# Patient Record
Sex: Female | Born: 1935
Health system: Southern US, Community
[De-identification: ages and names within clinical notes are randomized; demographics above are authoritative.]

## PROBLEM LIST (undated history)

## (undated) DIAGNOSIS — I350 Nonrheumatic aortic (valve) stenosis: Secondary | ICD-10-CM

## (undated) DIAGNOSIS — I071 Rheumatic tricuspid insufficiency: Secondary | ICD-10-CM

## (undated) DIAGNOSIS — M8580 Other specified disorders of bone density and structure, unspecified site: Secondary | ICD-10-CM

## (undated) DIAGNOSIS — E785 Hyperlipidemia, unspecified: Secondary | ICD-10-CM

## (undated) DIAGNOSIS — I10 Essential (primary) hypertension: Secondary | ICD-10-CM

## (undated) DIAGNOSIS — D649 Anemia, unspecified: Secondary | ICD-10-CM

## (undated) HISTORY — DX: Anemia, unspecified: D64.9

## (undated) HISTORY — DX: Hyperlipidemia, unspecified: E78.5

## (undated) HISTORY — PX: HEMORRHOID SURGERY: SHX153

## (undated) HISTORY — DX: Nonrheumatic aortic (valve) stenosis: I35.0

## (undated) HISTORY — DX: Rheumatic tricuspid insufficiency: I07.1

## (undated) HISTORY — PX: OTHER SURGICAL HISTORY: SHX169

## (undated) HISTORY — DX: Essential (primary) hypertension: I10

## (undated) HISTORY — DX: Other specified disorders of bone density and structure, unspecified site: M85.80

---

## 1999-12-11 ENCOUNTER — Encounter: Payer: Self-pay | Admitting: Internal Medicine

## 1999-12-11 ENCOUNTER — Encounter: Admission: RE | Admit: 1999-12-11 | Discharge: 1999-12-11 | Payer: Self-pay | Admitting: Internal Medicine

## 2000-02-12 ENCOUNTER — Other Ambulatory Visit: Admission: RE | Admit: 2000-02-12 | Discharge: 2000-02-12 | Payer: Self-pay | Admitting: Internal Medicine

## 2001-02-24 ENCOUNTER — Encounter: Admission: RE | Admit: 2001-02-24 | Discharge: 2001-03-23 | Payer: Self-pay | Admitting: Internal Medicine

## 2002-12-14 ENCOUNTER — Encounter: Payer: Self-pay | Admitting: Internal Medicine

## 2002-12-14 ENCOUNTER — Encounter: Admission: RE | Admit: 2002-12-14 | Discharge: 2002-12-14 | Payer: Self-pay | Admitting: Internal Medicine

## 2004-08-16 ENCOUNTER — Ambulatory Visit: Payer: Self-pay | Admitting: Family Medicine

## 2004-10-15 ENCOUNTER — Ambulatory Visit: Payer: Self-pay | Admitting: Internal Medicine

## 2004-10-24 ENCOUNTER — Ambulatory Visit: Payer: Self-pay | Admitting: Internal Medicine

## 2005-03-05 ENCOUNTER — Ambulatory Visit (HOSPITAL_COMMUNITY): Admission: RE | Admit: 2005-03-05 | Discharge: 2005-03-05 | Payer: Self-pay | Admitting: Obstetrics

## 2005-04-21 ENCOUNTER — Ambulatory Visit: Payer: Self-pay | Admitting: Internal Medicine

## 2005-04-22 ENCOUNTER — Ambulatory Visit: Payer: Self-pay | Admitting: Internal Medicine

## 2005-05-09 ENCOUNTER — Ambulatory Visit: Payer: Self-pay | Admitting: Internal Medicine

## 2005-07-28 HISTORY — PX: OTHER SURGICAL HISTORY: SHX169

## 2005-08-20 ENCOUNTER — Ambulatory Visit: Payer: Self-pay | Admitting: Internal Medicine

## 2005-10-20 ENCOUNTER — Ambulatory Visit: Payer: Self-pay | Admitting: Internal Medicine

## 2005-11-21 ENCOUNTER — Ambulatory Visit: Payer: Self-pay | Admitting: Internal Medicine

## 2006-03-23 ENCOUNTER — Ambulatory Visit (HOSPITAL_COMMUNITY): Admission: RE | Admit: 2006-03-23 | Discharge: 2006-03-23 | Payer: Self-pay | Admitting: Internal Medicine

## 2006-05-14 ENCOUNTER — Ambulatory Visit: Payer: Self-pay | Admitting: Internal Medicine

## 2006-07-09 ENCOUNTER — Ambulatory Visit: Payer: Self-pay | Admitting: Internal Medicine

## 2006-10-12 ENCOUNTER — Ambulatory Visit: Payer: Self-pay | Admitting: Internal Medicine

## 2006-10-21 ENCOUNTER — Ambulatory Visit: Payer: Self-pay | Admitting: Internal Medicine

## 2006-10-21 LAB — CONVERTED CEMR LAB
AST: 23 units/L (ref 0–37)
Cholesterol: 167 mg/dL (ref 0–200)
Glucose, Bld: 100 mg/dL — ABNORMAL HIGH (ref 70–99)
Potassium: 3.8 meq/L (ref 3.5–5.1)
Total CHOL/HDL Ratio: 3.1
Triglycerides: 123 mg/dL (ref 0–149)

## 2006-11-02 ENCOUNTER — Ambulatory Visit: Payer: Self-pay | Admitting: Internal Medicine

## 2006-11-04 ENCOUNTER — Encounter: Payer: Self-pay | Admitting: Internal Medicine

## 2006-11-05 ENCOUNTER — Ambulatory Visit: Payer: Self-pay | Admitting: Internal Medicine

## 2006-11-09 ENCOUNTER — Other Ambulatory Visit: Admission: RE | Admit: 2006-11-09 | Discharge: 2006-11-09 | Payer: Self-pay | Admitting: Obstetrics and Gynecology

## 2007-04-26 ENCOUNTER — Ambulatory Visit (HOSPITAL_COMMUNITY): Admission: RE | Admit: 2007-04-26 | Discharge: 2007-04-26 | Payer: Self-pay | Admitting: Internal Medicine

## 2007-05-11 ENCOUNTER — Ambulatory Visit: Payer: Self-pay | Admitting: Internal Medicine

## 2007-05-17 ENCOUNTER — Encounter (INDEPENDENT_AMBULATORY_CARE_PROVIDER_SITE_OTHER): Payer: Self-pay | Admitting: *Deleted

## 2007-08-18 ENCOUNTER — Ambulatory Visit: Payer: Self-pay | Admitting: Internal Medicine

## 2007-08-18 DIAGNOSIS — M8580 Other specified disorders of bone density and structure, unspecified site: Secondary | ICD-10-CM

## 2007-10-18 ENCOUNTER — Telehealth (INDEPENDENT_AMBULATORY_CARE_PROVIDER_SITE_OTHER): Payer: Self-pay | Admitting: *Deleted

## 2007-10-25 ENCOUNTER — Ambulatory Visit: Payer: Self-pay | Admitting: Internal Medicine

## 2007-10-25 DIAGNOSIS — R7989 Other specified abnormal findings of blood chemistry: Secondary | ICD-10-CM | POA: Insufficient documentation

## 2007-10-25 DIAGNOSIS — E559 Vitamin D deficiency, unspecified: Secondary | ICD-10-CM | POA: Insufficient documentation

## 2007-10-25 DIAGNOSIS — E785 Hyperlipidemia, unspecified: Secondary | ICD-10-CM

## 2007-10-25 DIAGNOSIS — I10 Essential (primary) hypertension: Secondary | ICD-10-CM | POA: Insufficient documentation

## 2007-11-01 ENCOUNTER — Ambulatory Visit: Payer: Self-pay | Admitting: Internal Medicine

## 2007-11-01 ENCOUNTER — Encounter (INDEPENDENT_AMBULATORY_CARE_PROVIDER_SITE_OTHER): Payer: Self-pay | Admitting: *Deleted

## 2007-11-01 LAB — CONVERTED CEMR LAB: OCCULT 3: NEGATIVE

## 2008-04-19 ENCOUNTER — Ambulatory Visit: Payer: Self-pay | Admitting: Internal Medicine

## 2008-04-27 ENCOUNTER — Ambulatory Visit (HOSPITAL_COMMUNITY): Admission: RE | Admit: 2008-04-27 | Discharge: 2008-04-27 | Payer: Self-pay | Admitting: Internal Medicine

## 2008-10-16 ENCOUNTER — Ambulatory Visit: Payer: Self-pay | Admitting: Internal Medicine

## 2008-10-24 ENCOUNTER — Ambulatory Visit: Payer: Self-pay | Admitting: Internal Medicine

## 2008-10-29 LAB — CONVERTED CEMR LAB
BUN: 11 mg/dL (ref 6–23)
Creatinine, Ser: 0.8 mg/dL (ref 0.4–1.2)
HDL: 44.4 mg/dL (ref >39.00)
Hgb A1c MFr Bld: 5.5 % (ref 4.6–6.5)
LDL Cholesterol: 86 mg/dL (ref 0–99)
Potassium: 4.4 meq/L (ref 3.5–5.1)
TSH: 1.4 microintl units/mL (ref 0.35–5.50)
Total Bilirubin: 0.8 mg/dL (ref 0.3–1.2)
Total CHOL/HDL Ratio: 3
VLDL: 16.6 mg/dL (ref 0.0–40.0)

## 2008-10-30 ENCOUNTER — Encounter (INDEPENDENT_AMBULATORY_CARE_PROVIDER_SITE_OTHER): Payer: Self-pay | Admitting: *Deleted

## 2008-10-30 ENCOUNTER — Ambulatory Visit: Payer: Self-pay | Admitting: Internal Medicine

## 2008-10-31 ENCOUNTER — Encounter: Payer: Self-pay | Admitting: Internal Medicine

## 2008-11-09 ENCOUNTER — Telehealth: Payer: Self-pay | Admitting: Internal Medicine

## 2008-11-11 ENCOUNTER — Ambulatory Visit: Payer: Self-pay | Admitting: Family Medicine

## 2009-02-23 ENCOUNTER — Ambulatory Visit: Payer: Self-pay | Admitting: Family Medicine

## 2009-04-30 ENCOUNTER — Ambulatory Visit (HOSPITAL_COMMUNITY): Admission: RE | Admit: 2009-04-30 | Discharge: 2009-04-30 | Payer: Self-pay | Admitting: Internal Medicine

## 2009-05-01 ENCOUNTER — Ambulatory Visit: Payer: Self-pay | Admitting: Internal Medicine

## 2009-05-01 DIAGNOSIS — L259 Unspecified contact dermatitis, unspecified cause: Secondary | ICD-10-CM

## 2009-05-04 ENCOUNTER — Telehealth (INDEPENDENT_AMBULATORY_CARE_PROVIDER_SITE_OTHER): Payer: Self-pay | Admitting: *Deleted

## 2009-05-15 ENCOUNTER — Ambulatory Visit: Payer: Self-pay | Admitting: Internal Medicine

## 2009-07-27 ENCOUNTER — Encounter: Payer: Self-pay | Admitting: Internal Medicine

## 2009-08-09 ENCOUNTER — Telehealth (INDEPENDENT_AMBULATORY_CARE_PROVIDER_SITE_OTHER): Payer: Self-pay | Admitting: *Deleted

## 2009-09-27 ENCOUNTER — Ambulatory Visit: Payer: Self-pay | Admitting: Internal Medicine

## 2009-09-27 ENCOUNTER — Telehealth (INDEPENDENT_AMBULATORY_CARE_PROVIDER_SITE_OTHER): Payer: Self-pay | Admitting: *Deleted

## 2009-09-27 LAB — CONVERTED CEMR LAB
Nitrite: NEGATIVE
Specific Gravity, Urine: <1.005
WBC Urine, dipstick: NEGATIVE

## 2009-10-08 LAB — CONVERTED CEMR LAB
ALT: 22 units/L (ref 0–35)
Albumin: 3.6 g/dL (ref 3.5–5.2)
Basophils Absolute: 0 10*3/uL (ref 0.0–0.1)
Basophils Relative: 0.3 % (ref 0.0–3.0)
Bilirubin, Direct: 0.1 mg/dL (ref 0.0–0.3)
Eosinophils Absolute: 0.4 10*3/uL (ref 0.0–0.7)
GFR calc non Af Amer: 72.74 mL/min (ref >60)
HDL: 65.8 mg/dL (ref >39.00)
Lymphocytes Relative: 33 % (ref 12.0–46.0)
MCHC: 33.5 g/dL (ref 30.0–36.0)
Monocytes Relative: 10 % (ref 3.0–12.0)
Neutrophils Relative %: 49.4 % (ref 43.0–77.0)
Potassium: 3.7 meq/L (ref 3.5–5.1)
RBC: 4.34 M/uL (ref 3.87–5.11)
RDW: 12 % (ref 11.5–14.6)
Sodium: 139 meq/L (ref 135–145)
Total Protein: 6.6 g/dL (ref 6.0–8.3)
VLDL: 15.2 mg/dL (ref 0.0–40.0)
Vit D, 25-Hydroxy: 54 ng/mL (ref 30–89)

## 2009-11-01 ENCOUNTER — Ambulatory Visit: Payer: Self-pay | Admitting: Internal Medicine

## 2009-11-02 ENCOUNTER — Encounter: Payer: Self-pay | Admitting: Internal Medicine

## 2010-02-22 ENCOUNTER — Ambulatory Visit: Payer: Self-pay | Admitting: Internal Medicine

## 2010-02-26 ENCOUNTER — Encounter: Payer: Self-pay | Admitting: Internal Medicine

## 2010-03-04 ENCOUNTER — Ambulatory Visit: Payer: Self-pay | Admitting: Internal Medicine

## 2010-04-29 ENCOUNTER — Ambulatory Visit: Payer: Self-pay | Admitting: Internal Medicine

## 2010-05-01 ENCOUNTER — Ambulatory Visit (HOSPITAL_COMMUNITY): Admission: RE | Admit: 2010-05-01 | Discharge: 2010-05-01 | Payer: Self-pay | Admitting: Internal Medicine

## 2010-06-04 ENCOUNTER — Ambulatory Visit: Payer: Self-pay | Admitting: Internal Medicine

## 2010-06-04 LAB — CONVERTED CEMR LAB
ALT: 15 units/L (ref 0–35)
AST: 20 units/L (ref 0–37)
Albumin: 3.5 g/dL (ref 3.5–5.2)
HDL: 51.8 mg/dL (ref >39.00)
Total CK: 27 units/L (ref 7–177)
Total Protein: 5.8 g/dL — ABNORMAL LOW (ref 6.0–8.3)
Triglycerides: 89 mg/dL (ref 0.0–149.0)

## 2010-06-10 ENCOUNTER — Telehealth (INDEPENDENT_AMBULATORY_CARE_PROVIDER_SITE_OTHER): Payer: Self-pay | Admitting: *Deleted

## 2010-06-12 ENCOUNTER — Ambulatory Visit: Payer: Self-pay | Admitting: Internal Medicine

## 2010-06-12 DIAGNOSIS — J069 Acute upper respiratory infection, unspecified: Secondary | ICD-10-CM | POA: Insufficient documentation

## 2010-06-12 DIAGNOSIS — M79609 Pain in unspecified limb: Secondary | ICD-10-CM | POA: Insufficient documentation

## 2010-06-17 ENCOUNTER — Encounter: Payer: Self-pay | Admitting: Internal Medicine

## 2010-06-28 ENCOUNTER — Ambulatory Visit: Payer: Self-pay

## 2010-06-28 ENCOUNTER — Encounter: Payer: Self-pay | Admitting: Internal Medicine

## 2010-08-25 LAB — CONVERTED CEMR LAB
ALT: 24 units/L (ref 0–35)
BUN: 10 mg/dL (ref 6–23)
Bilirubin, Direct: 0.1 mg/dL (ref 0.0–0.3)
Creatinine, Ser: 0.8 mg/dL (ref 0.4–1.2)
HDL goal, serum: 40 mg/dL
Potassium: 4.2 meq/L (ref 3.5–5.1)
TSH: 1.24 microintl units/mL (ref 0.35–5.50)
Total Bilirubin: 0.8 mg/dL (ref 0.3–1.2)
VLDL: 17 mg/dL (ref 0–40)
Vit D, 1,25-Dihydroxy: 37 (ref 30–89)

## 2010-08-29 NOTE — Assessment & Plan Note (Signed)
Summary: flu shot/cbs  Nurse Visit   Allergies: 1)  ! Pcn 2)  ! Sulfa 3)  ! Flagyl 4)  ! Darvocet 5)  ! Doxycycline  Orders Added: 1)  Flu Vaccine 35yrs + MEDICARE PATIENTS [Q2039] 2)  Administration Flu vaccine - MCR [G0008] Flu Vaccine Consent Questions     Do you have a history of severe allergic reactions to this vaccine? no    Any prior history of allergic reactions to egg and/or gelatin? no    Do you have a sensitivity to the preservative Thimersol? no    Do you have a past history of Guillan-Barre Syndrome? no    Do you currently have an acute febrile illness? no    Have you ever had a severe reaction to latex? no    Vaccine information given and explained to patient? yes    Are you currently pregnant? no    Lot Number:[id number]   Exp Date:01/25/2011   Site Given  Left Deltoid IM.lbmedflu

## 2010-08-29 NOTE — Miscellaneous (Signed)
Summary: Orders Update  Clinical Lists Changes  Orders: Added new Test order of Arterial Duplex Lower Extremity (Arterial Duplex Low) - Signed 

## 2010-08-29 NOTE — Assessment & Plan Note (Signed)
Summary: 4 month roa//lch   Vital Signs:  Patient profile:   77 year old female Weight:      159.2 pounds BMI:     27.64 Pulse rate:   60 / minute Resp:     16 per minute BP sitting:   124 / 72  (left arm) Cuff size:   regular  Vitals Entered By: Shonna Chock CMA (March 04, 2010 10:27 AM) CC: 4 Month follow-up, Lipid Management   CC:  4 Month follow-up and Lipid Management.  History of Present Illness: NMR Lipoprofile reviewed & risks discussed.No specific diet , but heart healthy. She had myalgias on statin . Decreased CVE with heat  but water exercise 2X/ week w/o symptoms. No FH of premature CAD. Adjunctive measures currently used by the patient include ASA and fish oil supplements.    Lipid Management History:      Positive NCEP/ATP III risk factors include female age 48 years old or older, early menopause without estrogen hormone replacement, and hypertension.  Negative NCEP/ATP III risk factors include non-diabetic, HDL cholesterol greater than 60, no family history for ischemic heart disease, non-tobacco-user status, no ASHD (atherosclerotic heart disease), no prior stroke/TIA, no peripheral vascular disease, and no history of aortic aneurysm.     Current Medications (verified): 1)  Hydrochlorothiazide 12.5 Mg  Tabs (Hydrochlorothiazide) .Marland Kitchen.. 1 Once Daily 2)  Asa 81mg  .... Once Daily 3)  Viactiv Mvi .... Once Daily 4)  Vit E 400 Iu .... Qd 5)  Fish Oil 1000mg  .... 2 By Mouth Once Daily 6)  L-Lysine 500mg  .... 1 By Mouth Qd 7)  Glucosamine .... Bid 8)  Calcium With Vit D 1200/1000 .... Qd 9)  Vit D3 1000mg  .... 1 By Mouth Once Daily 10)  Vitamin C 500 Mg Tabs (Ascorbic Acid) .... Liquid Gels 1 By Mouth Once Daily 11)  Zyrtec Allergy 10 Mg Tabs (Cetirizine Hcl) .... As Needed 12)  Metoprolol Tartrate 25 Mg Tabs (Metoprolol Tartrate) .Marland Kitchen.. 1 Two Times A Day  Allergies: 1)  ! Pcn 2)  ! Sulfa 3)  ! Flagyl 4)  ! Darvocet 5)  ! Doxycycline  Past History:  Past Medical  History: Hyperlipidemia: NMR 2011: LDL 144 ( 2209/534), HDL 53, TG92. LDL goal = < 100.[place] Study LDL goal = < 130. Hypertension Osteopenia (T score -1.7 @ hip) SBE prophylaxis Vitamin D deficiency  Past Surgical History: hemorrhoid surgery gravida 0, para 0, mis 0, Dr  Edyth Gunnels Synvisc injections R knee, Dr Lunette Stands cataract surgery 11/2005 and 12/1998  Review of Systems CV:  Denies chest pain or discomfort, leg cramps with exertion, palpitations, and shortness of breath with exertion.  Physical Exam  General:  Appears younger than age,well-nourished; alert,appropriate and cooperative throughout examination Lungs:  Normal respiratory effort, chest expands symmetrically. Lungs are clear to auscultation, no crackles or wheezes. Heart:  normal rate, regular rhythm, no gallop, no rub, no JVD, and grade 1.5  /6 systolic murmur.   Pulses:  R and L carotid,radial,dorsalis pedis and posterior tibial pulses are full and equal bilaterally Extremities:  1/2 + left pedal edema and 1/2 + right pedal edema.     Impression & Recommendations:  Problem # 1:  HYPERLIPIDEMIA (ICD-272.4)  Her updated medication list for this problem includes:    Pravastatin Sodium 20 Mg Tabs (Pravastatin sodium) .Marland Kitchen... 1 at bedtime  Complete Medication List: 1)  Hydrochlorothiazide 12.5 Mg Tabs (hydrochlorothiazide)  .Marland Kitchen.. 1 once daily 2)  Asa 81mg   .... Once  daily 3)  Viactiv Mvi  .... Once daily 4)  Vit E 400 Iu  .... Qd 5)  Fish Oil 1000mg   .... 2 by mouth once daily 6)  L-lysine 500mg   .... 1 by mouth qd 7)  Glucosamine  .... Bid 8)  Calcium With Vit D 1200/1000  .... Qd 9)  Vit D3 1000mg   .... 1 by mouth once daily 10)  Vitamin C 500 Mg Tabs (Ascorbic acid) .... Liquid gels 1 by mouth once daily 11)  Zyrtec Allergy 10 Mg Tabs (Cetirizine hcl) .... As needed 12)  Metoprolol Tartrate 25 Mg Tabs (Metoprolol tartrate) .Marland Kitchen.. 1 two times a day 13)  Pravastatin Sodium 20 Mg Tabs (Pravastatin  sodium) .Marland Kitchen.. 1 at bedtime  Lipid Assessment/Plan:      Based on NCEP/ATP III, the patient's risk factor category is "2 or more risk factors and a calculated 10 year CAD risk of < 20%".  The patient's lipid goals are as follows: Total cholesterol goal is 200; LDL cholesterol goal is 130; HDL cholesterol goal is 40; Triglyceride goal is 150.  Her LDL cholesterol goal has been met.    Patient Instructions: 1)  Please schedule a follow-up appointment in 3 months. 2)  Hepatic Panel, CPK prior to visit, ICD-9:995.20 3)  Lipid Panel prior to visit, ICD-9:272.4 4)  It is important that you exercise regularly at least 20 minutes 5 times a week. If you develop chest pain, have severe difficulty breathing, or feel very tired , stop exercising immediately and seek medical attention. 5)  Take an  81 mg coated Aspirin every day. Prescriptions: PRAVASTATIN SODIUM 20 MG TABS (PRAVASTATIN SODIUM) 1 at bedtime  #90 x 0   Entered and Authorized by:   Marga Melnick MD   Signed by:   Marga Melnick MD on 03/04/2010   Method used:   Print then Give to Patient   RxID:   [id number]

## 2010-08-29 NOTE — Medication Information (Signed)
Summary: Formulary Letter/[facility]  Formulary Letter/[facility]   Imported By: Lanelle Bal 10/03/2009 11:45:19  _____________________________________________________________________  External Attachment:    Type:   Image     Comment:   External Document

## 2010-08-29 NOTE — Assessment & Plan Note (Signed)
Summary: MED REFILL//PH   Vital Signs:  Patient profile:   77 year old female Height:      63.75 inches Weight:      160.4 pounds Temp:     98.2 degrees F oral Pulse rate:   58 / minute Resp:     16 per minute BP sitting:   130 / 72  (left arm) Cuff size:   large  Vitals Entered By: Shonna Chock (November 01, 2009 10:46 AM)  Comments REVIEWED MED LIST, PATIENT AGREED DOSE AND INSTRUCTION CORRECT    History of Present Illness: Denise Wagner is here for a BCBS /Medicare physical; she is asymptomatic . She is practicing preventive health interventions; immunizations  & Living Will  up to date.  Allergies: 1)  ! Pcn 2)  ! Sulfa 3)  ! Flagyl 4)  ! Darvocet 5)  ! Doxycycline  Past History:  Past Medical History: Hyperlipidemia Hypertension Osteopenia (T score -1.7 @ hip) SBE prophylaxis Vitamin D deficiency  Past Surgical History: hemorrhoid surgery gravid 0, para 0, mis 0, Dr  Edyth Gunnels Synvisc injections R knee, Dr Lunette Stands cataract surgery 11/2005 and 12/1998  Family History: maternal grandmother: diabetes mother: arthritis cousin: MI; brother: MI in 4s  Social History: Never Smoked Retired Alcohol use-no Alcohol use-yes: rare; no diet  Review of Systems  The patient denies anorexia, fever, weight loss, weight gain, vision loss, decreased hearing, hoarseness, syncope, prolonged cough, headaches, hemoptysis, abdominal pain, melena, hematochezia, severe indigestion/heartburn, hematuria, incontinence, suspicious skin lesions, difficulty walking, depression, unusual weight change, abnormal bleeding, enlarged lymph nodes, and angioedema.   CV:  Complains of swelling of feet; denies chest pain or discomfort, difficulty breathing at night, difficulty breathing while lying down, leg cramps with exertion, palpitations, shortness of breath with exertion, and swelling of hands; Edema with prolonged travel. MS:  Complains of joint pain and low back pain; denies  joint redness, joint swelling, mid back pain, and thoracic pain; Diffuse arthragias " pre rain".  Physical Exam  General:  well-nourished,appears younger than age; alert,appropriate and cooperative throughout examination Head:  Normocephalic and atraumatic without obvious abnormalities. Eyes:  No corneal or conjunctival inflammation noted.Perrla. Funduscopic exam benign, without hemorrhages, exudates or papilledema.  Ears:  External ear exam shows no significant lesions or deformities.  Otoscopic examination reveals clear canals, tympanic membranes are intact bilaterally without bulging, retraction, inflammation or discharge. Hearing is grossly normal bilaterally. Nose:  External nasal examination shows no deformity or inflammation. Nasal mucosa are pink and moist without lesions or exudates. Mouth:  Oral mucosa and oropharynx without lesions or exudates.  Dentures Neck:  No deformities, masses, or tenderness noted. Asymmetric R  thyroid lobe > L Chest Wall:  R clavicular head > L w/o tenderness Lungs:  Normal respiratory effort, chest expands symmetrically. Lungs are clear to auscultation, no crackles or wheezes. Heart:  normal rate, regular rhythm, no gallop, no rub, no JVD, no HJR, and grade 1 /6 systolic murmur.   Abdomen:  Bowel sounds positive,abdomen soft and non-tender without masses, organomegaly or hernias noted. Genitalia:  Dr Eda Paschal Msk:  No deformity or scoliosis noted of thoracic or lumbar spine.   Pulses:  R and L carotid,radial,dorsalis pedis and posterior tibial pulses are full and equal bilaterally Extremities:  No clubbing, cyanosis. OA hand changes. Lipedema. Crepitus of knees Neurologic:  alert & oriented X3 and DTRs symmetrical and normal.   Skin:  Intact without suspicious lesions or rashes Cervical Nodes:  No lymphadenopathy noted Axillary Nodes:  No palpable lymphadenopathy Psych:  memory intact for recent and remote, normally interactive, and good eye contact.      Impression & Recommendations:  Problem # 1:  ROUTINE GENERAL MEDICAL EXAM@HEALTH  CARE FACL (ICD-V70.0)  Problem # 2:  HYPERTENSION, ESSENTIAL NOS (ICD-401.9)  controlled The following medications were removed from the medication list:    Propranolol Hcl 40 Mg Tabs (Propranolol hcl) .Marland Kitchen... 1/2 tab two times a day Her updated medication list for this problem includes:    Metoprolol Tartrate 25 Mg Tabs (Metoprolol tartrate) .Marland Kitchen... 1 two times a day  Orders: Prescription Created Electronically [phone])  Problem # 3:  HYPERLIPIDEMIA (ICD-272.4)  The following medications were removed from the medication list:    Advicor 500-20 Mg Tb24 (Niacin-lovastatin) .Marland Kitchen... 1 by mouth qd  Problem # 4:  VITAMIN D DEFICIENCY (ICD-268.9)  Problem # 5:  OSTEOPENIA (ICD-733.90)  Complete Medication List: 1)  Hydrochlorothiazide 12.5 Mg Tabs (hydrochlorothiazide)  .Marland Kitchen.. 1 once daily 2)  Asa 81mg   .... Once daily 3)  Viactiv Mvi  .... Once daily 4)  Vit E 400 Iu  .... Qd 5)  Fish Oil 1000mg   .... 2 by mouth once daily 6)  L-lysine 500mg   .... 1 by mouth qd 7)  Glucosamine  .... Bid 8)  Calcium With Vit D 1200/1000  .... Qd 9)  Vit D3 1000mg   .... 1 by mouth once daily 10)  Vitamin C 500 Mg Tabs (Ascorbic acid) .... Liquid gels 1 by mouth once daily 11)  Zyrtec Allergy 10 Mg Tabs (Cetirizine hcl) .... As needed 12)  Metoprolol Tartrate 25 Mg Tabs (Metoprolol tartrate) .Marland Kitchen.. 1 two times a day  Patient Instructions: 1)  Stop Advicor . 2)  Please schedule a follow-up appointment in 4 months. 3)  NMR Lipoprofile Lipid Panel  10 days prior to visit, ICD-9:272.4 Prescriptions: METOPROLOL TARTRATE 25 MG TABS (METOPROLOL TARTRATE) 1 two times a day  #180 x 3   Entered and Authorized by:   Marga Melnick MD   Signed by:   Marga Melnick MD on 11/01/2009   Method used:   Printed then faxed to ...       [facility] (mail-order)       [address]       [place], [place]   [address]       Ph: [phone]       Fax: [phone]   RxID:   [id number] HYDROCHLOROTHIAZIDE 12.5 MG  TABS (HYDROCHLOROTHIAZIDE) 1 once daily  #90 x 3   Entered and Authorized by:   Marga Melnick MD   Signed by:   Marga Melnick MD on 11/01/2009   Method used:   Printed then faxed to ...       [facility] (mail-order)       [address]       [place], [place]  [address]       Ph: [phone]       Fax: [phone]   RxID:   [id number]

## 2010-08-29 NOTE — Progress Notes (Signed)
Summary: patient made appt for 11/16  ---- Converted from flag ---- ---- 06/09/2010 2:22 PM, Marga Melnick MD wrote: please verify F/U appt with all meds ------------------------------  Phone Note Outgoing Call Call back at [facility] Phone [phone]   Action Taken: Appt scheduled Summary of Call: patient made appointment for 11/16//2011 at 9:20 am--will bring all medication bottles Initial call taken by: Jerolyn Shin,  June 10, 2010 11:35 AM

## 2010-08-29 NOTE — Assessment & Plan Note (Signed)
Summary: followup --will bring all meds///sph   Vital Signs:  Patient profile:   77 year old female Height:      63.75 inches Weight:      156 pounds Temp:     98.1 degrees F oral Pulse rate:   68 / minute Resp:     16 per minute BP sitting:   140 / 72  (left arm)  Vitals Entered By: Jeremy Johann CMA (June 12, 2010 9:37 AM) CC: F/U Labs, URI symptoms   CC:  F/U Labs and URI symptoms.  History of Present Illness: Hyperlipidemia Follow-Up      This is an 77 year old woman who presents for Hyperlipidemia follow-up.  The patient reports muscle aches, but denies GI upset, abdominal pain, flushing, itching, constipation, diarrhea, and fatigue.  The patient denies the following symptoms: chest pain/pressure, exercise intolerance, dypsnea, palpitations, syncope, and pedal edema.  Compliance with medications (by patient report) has been near 100%.  Dietary compliance has been good.  The patient reports no exercise.  Adjunctive measures currently used by the patient include ASA and fish oil supplements.  Lipids are excellent & CK WNL; but she has some muscle pain in calves with walking. She feels she did not have this off statin.NMR Lipoprofile LDL goal = < 100. URI Symptoms      The patient also presents with URI symptoms as of 11/15.  The patient reports nasal congestion, minimal  purulent nasal discharge, and sore throat, but denies productive cough.  The patient denies fever, dyspnea, and wheezing.  The patient also reports itchy watery eyes, sneezing, and minimal headache.  The patient denies the following risk factors for Strep sinusitis: bilateral facial pain, tooth pain, and tender adenopathy.    Current Medications (verified): 1)  Hydrochlorothiazide 12.5 Mg  Tabs (Hydrochlorothiazide) .Marland Kitchen.. 1 Once Daily 2)  Asa 81mg  .... Once Daily 3)  Viactiv Mvi .... Once Daily 4)  Vit E 400 Iu .... Qd 5)  Fish Oil 1000mg  .... 2 By Mouth Once Daily 6)  L-Lysine 500mg  .... 1 By Mouth Qd 7)   Glucosamine .Marland Kitchen.. 2 Tab Once Daily 8)  Calcium With Vit D 1200/1000 .Marland Kitchen.. 1 Tab Two Times A Day 9)  Vit D3 1000mg  .... 1 By Mouth Once Daily 10)  Vitamin C 500 Mg Tabs (Ascorbic Acid) .... Liquid Gels 1 By Mouth Once Daily 11)  Zyrtec Allergy 10 Mg Tabs (Cetirizine Hcl) .... As Needed 12)  Metoprolol Tartrate 25 Mg Tabs (Metoprolol Tartrate) .Marland Kitchen.. 1 Two Times A Day 13)  Pravastatin Sodium 20 Mg Tabs (Pravastatin Sodium) .Marland Kitchen.. 1 At Bedtime  Allergies (verified): 1)  ! Pcn 2)  ! Sulfa 3)  ! Flagyl 4)  ! Darvocet 5)  ! Doxycycline  Physical Exam  General:  Appears younger than age,well-nourished,in no acute distress; alert,appropriate and cooperative throughout examination Ears:  External ear exam shows no significant lesions or deformities.  Otoscopic examination reveals clear canals, tympanic membranes are intact bilaterally without bulging, retraction, inflammation or discharge. Hearing is grossly normal bilaterally. Nose:  External nasal examination shows no deformity or inflammation. Nasal mucosa are pink and moist without lesions or exudates.Hyponasal speech Mouth:  Oral mucosa and oropharynx without lesions or exudates.  Dentures Lungs:  Normal respiratory effort, chest expands symmetrically. Lungs are clear to auscultation, no crackles or wheezes. Heart:  normal rate, regular rhythm, no gallop, no rub, no JVD, and grade  1/6 systolic murmur.   Pulses:  R and L carotid,radial,dorsalis pedis and  posterior tibial pulses are full and equal bilaterally Skin:  Intact without suspicious lesions. Slightly damp Cervical Nodes:  No lymphadenopathy noted Axillary Nodes:  No palpable lymphadenopathy Psych:  memory intact for recent and remote, normally interactive, and good eye contact.     Impression & Recommendations:  Problem # 1:  HYPERLIPIDEMIA (ICD-272.4)  Her updated medication list for this problem includes:    Pravastatin Sodium 20 Mg Tabs (Pravastatin sodium) .Marland Kitchen... 1 at  bedtime  Orders: LE Arterial Doppler/ABI (Le arterial doppler)  Problem # 2:  LEG PAIN, BILATERAL (ICD-729.5)  R/O claudication  Orders: LE Arterial Doppler/ABI (Le arterial doppler)  Problem # 3:  URI (ICD-465.9)  Her updated medication list for this problem includes:    Zyrtec Allergy 10 Mg Tabs (Cetirizine hcl) .Marland Kitchen... As needed  Problem # 4:  PHARYNGITIS-ACUTE (ICD-462) criteria for Strep pharyngitis or rhinosinusitis not met  Complete Medication List: 1)  Hydrochlorothiazide 12.5 Mg Tabs (hydrochlorothiazide)  .Marland Kitchen.. 1 once daily 2)  Asa 81mg   .... Once daily 3)  Viactiv Mvi  .... Once daily 4)  Vit E 400 Iu  .... Qd 5)  Fish Oil 1000mg   .... 2 by mouth once daily 6)  L-lysine 500mg   .... 1 by mouth qd 7)  Glucosamine  .Marland Kitchen.. 2 tab once daily 8)  Calcium With Vit D 1200/1000  .Marland Kitchen.. 1 tab two times a day 9)  Vit D3 1000mg   .... 1 by mouth once daily 10)  Vitamin C 500 Mg Tabs (Ascorbic acid) .... Liquid gels 1 by mouth once daily 11)  Zyrtec Allergy 10 Mg Tabs (Cetirizine hcl) .... As needed 12)  Metoprolol Tartrate 25 Mg Tabs (Metoprolol tartrate) .Marland Kitchen.. 1 two times a day 13)  Pravastatin Sodium 20 Mg Tabs (Pravastatin sodium) .Marland Kitchen.. 1 at bedtime  Patient Instructions: 1)  Vitamin C 2000 mg once daily ; Echinacea X 5 days ; & Zicam Melts as needed for sore throat. Call for facial pain,  fever , purulent secretions . Prescriptions: PRAVASTATIN SODIUM 20 MG TABS (PRAVASTATIN SODIUM) 1 at bedtime  #90 x 3   Entered and Authorized by:   Marga Melnick MD   Signed by:   Marga Melnick MD on 06/12/2010   Method used:   Print then Give to Patient   RxID:   [id number]    Orders Added: 1)  Est. Patient Level IV [69629] 2)  LE Arterial Doppler/ABI [Le arterial doppler]

## 2010-08-29 NOTE — Progress Notes (Signed)
Summary: RX  Phone Note Call from Patient Call back at [facility] Phone [phone]   Caller: Patient Reason for Call: Refill Medication Summary of Call: PT CAME IN FOR LABS AND LEFT PAPER ASKING FOR REFILL ON HER MED--PROPRANOLOL 40 MG 1/2 IN THE MORNING AND 1/2 AT NIGHT, HTCZ-25 MG 1/2 A DAY NEED 30 DAY SUPPLY TIL APPT. IN APRIL-- CALL AND SHE WILL COME AND PICK UP THE SCRIPT WHEN READY. Initial call taken by: Freddy Jaksch,  September 27, 2009 8:10 AM  Follow-up for Phone Call        Left message on machine for patient to return call when avaliable. Follow-up by: Shonna Chock,  September 27, 2009 10:13 AM  Additional Follow-up for Phone Call Additional follow up Details #1::        Spoke with patient, paitent said she would like for me to mail her a 30day script for HCTZ and Propranolol. Patient saids the paper that she left were for Dr.Hopper to review and futher discuss when she comes in for her appointment. Patient's Advicor will no longer be covered and she will need an alternative med  (to be futher discussed at pending appointment in april).    Additional Follow-up by: Shonna Chock,  September 27, 2009 10:53 AM    Prescriptions: HYDROCHLOROTHIAZIDE 25 MG  TABS (HYDROCHLOROTHIAZIDE) 1/2 tab qd  #15 x 0   Entered by:   Shonna Chock   Authorized by:   Marga Melnick MD   Signed by:   Shonna Chock on 09/27/2009   Method used:   Print then Give to Patient   RxID:   [id number] PROPRANOLOL HCL 40 MG  TABS (PROPRANOLOL HCL) 1/2 tab two times a day  #30 x 0   Entered by:   Shonna Chock   Authorized by:   Marga Melnick MD   Signed by:   Shonna Chock on 09/27/2009   Method used:   Print then Give to Patient   RxID:   [id number]

## 2010-08-29 NOTE — Progress Notes (Signed)
Summary: LABS ORDERS  Phone Note Call from Patient   Summary of Call: NEED ORDERS TO PUT IN FOR LABS ON 3.2.10 FOR MS. [facility]. Initial call taken by: Freddy Jaksch,  August 09, 2009 10:55 AM  Follow-up for Phone Call        LIPID,HEP,CBCD,TSH,BMP,VIT D,UDIP 272.4/790.6/401.9/268.9 Follow-up by: Shonna Chock,  August 09, 2009 1:38 PM  Additional Follow-up for Phone Call Additional follow up Details #1::        ORDERS WAS ADDED TO APPT Additional Follow-up by: Freddy Jaksch,  August 10, 2009 9:56 AM

## 2010-12-13 NOTE — Assessment & Plan Note (Signed)
[facility]                        [place] OFFICE NOTE   Denise Wagner, Denise Wagner                     MRN:          [id number]  DATE:10/12/2006                            DOB:          [birth year]    The patient is seen for medication refill October 12, 2006.  Her age is  77.   Her major complaints consist of postnasal drainage and hacking cough.  She was treated for rhinosinusitis in December 2007.  She continues to  have occasional yellow nasal discharge in the mornings.  She also has  occasional shortness of breath.  She denies fever,  facial pain,  earache, or dental pain (the patient is edentulous).   PAST MEDICAL HISTORY:  1. Hemorrhoidectomy.  2. Outpatient injection to her knees by Dr. Charlett Blake.  3. Cataract surgery bilaterally in 2007.   MEDICATIONS:  1. Glucosamine sulfate.  2. Advicor 500/20.  3. Propranolol 40 mg twice a day.  4. Hydrochlorothiazide 25 mg 1/2 daily.  5. Aspirin 81 mg.  6. Multivitamins  7. Supplements.   MEDICAL PROBLEMS:  1. Hypertension.  2. Dyslipidemia.  3. Osteopenia.  She has not had a bone density since 2004.  This will      be scheduled.   FAMILY HISTORY:  Includes diabetes in father and mother, arthritis in  mother, MI in a cousin.   She has never smoked, rarely drinks.  She has been somewhat sedentary  over the winter.  She is on no specific diet.   REVIEW OF SYSTEMS:  Otherwise negative.  She has no GI symptoms but has  not had a colonoscopy.  She makes the disclaimer  I'm 77 years old and  have had no problems lately.  Her stool cards have been negative in the  past.  She is a very hardy octogenarian.   She has had no gynecologic followup despite her nulliparous state; the  risks have been discussed with her, and I will make a referral to Dr  Eda Paschal, Gynecologist.   PHYSICAL EXAMINATION:  VITAL SIGNS: Her weight is down approximately 2  pounds from 159.  Pulse is 64, respiratory rate  17, blood pressure  130/74.  HEENT: She has full extraocular motion.  The nares are patent.  There is  slightly increased was on the right, but tympanic membranes are normal.  NECK:  Thyroid is normal to palpation.  CHEST:  Clear with no rales or rhonchi.  CARDIAC:  She has a 1-1/2 systolic murmur. She has no carotid bruits.  There is no aortic aneurysm. Pedal pulses are decreased.  ABDOMEN:  She has no organomegaly.  LYMPH:  She has no lymphadenopathy.  EXTREMITIES:  She has marked crepitus of the knees and mild degenerative  changes to the hands.  NEUROPSYCHIATRIC:  No deficits.   Fasting labs will be scheduled to follow up her therapy.   Stool cards will be repeated; the standards for colonoscopy were  discussed with her.  I reinforced that I am trying to treat her  physiologically, not chronologically.   ALLERGIES:  1. FLAGYL.  2. DOXYCYCLINE.  3. SULFA. (copy to Dr.  Gottsegen)     Titus Dubin. Alwyn Ren, MD,FACP,FCCP  Electronically Signed    [facility]  DD: 10/12/2006  DT: 10/12/2006  Job #: [id number]

## 2011-02-21 ENCOUNTER — Encounter: Payer: Self-pay | Admitting: Internal Medicine

## 2011-03-10 ENCOUNTER — Other Ambulatory Visit: Payer: Self-pay | Admitting: Internal Medicine

## 2011-03-10 NOTE — Telephone Encounter (Signed)
Patient needs to schedule a CPX for 05/2011

## 2011-04-16 ENCOUNTER — Encounter: Payer: Self-pay | Admitting: Internal Medicine

## 2011-04-16 ENCOUNTER — Ambulatory Visit (INDEPENDENT_AMBULATORY_CARE_PROVIDER_SITE_OTHER): Payer: Medicare Other | Admitting: Internal Medicine

## 2011-04-16 VITALS — BP 136/72 | HR 61 | Temp 98.7°F | Wt 154.4 lb

## 2011-04-16 DIAGNOSIS — J069 Acute upper respiratory infection, unspecified: Secondary | ICD-10-CM

## 2011-04-16 NOTE — Patient Instructions (Signed)
Zicam Melts or Zinc lozenges ; vitamin C 2000 mg daily; & Echinacea for 4-7 days. Report fever, exudate("pus") or progressive pain. Fill Z-pack if these appear. Plain Mucinex for thick secretions ;force NON dairy fluids for next 48 hrs. Use a Neti pot daily as needed for sinus congestion

## 2011-04-16 NOTE — Progress Notes (Signed)
  Subjective:    Patient ID: Denise Wagner, female    DOB: 06/28/1924, 77 y.o.   MRN: [id number]  HPI Respiratory tract infection Onset/symptoms:9/13 as ST Exposures (illness/environmental/extrinsic):no Progression of symptoms:some decrease in purulent secretion Treatments/response:Emergen  C, Zinc tablets Present symptoms: Fever/chills/sweats:no Frontal headache:no Facial pain:no Nasal purulence:yellow but clearing Sore throat:not now Dental pain:dentures Lymphadenopathy:no Wheezing/shortness of breath:no Cough/sputum/hemoptysis:scant "gunk" Associated extrinsic/allergic symptoms:itchy eyes/ sneezing:no Past medical history: Seasonal allergies: yes/asthma:no Smoking history:never           Review of Systems     Objective:   Physical Exam General appearance is of good health and nourishment; no acute distress or increased work of breathing is present.  No  lymphadenopathy about the head, neck, or axilla noted.   Eyes: No conjunctival inflammation or lid edema is present.  Ears:  External ear exam shows no significant lesions or deformities.  Otoscopic examination reveals clear canals, tympanic membranes are intact bilaterally without bulging, retraction, inflammation or discharge.  Nose:  External nasal examination shows no deformity or inflammation. Nasal mucosa are pink and moist without lesions or exudates. No septal dislocation or dislocation.No obstruction to airflow.   Oral exam: Dentures.There is no oropharyngeal erythema or exudate noted.   Heart:  Slow  rate and regular rhythm. S1 and S2 normal without gallop, murmur, click, rub .S 4 Lungs:Chest clear to auscultation; no wheezes, rhonchi,rales ,or rubs present.No increased work of breathing.    Extremities:  No cyanosis, edema, or clubbing  noted    Skin: Warm & dry w/o jaundice or tenting.         Assessment & Plan:  #1 upper respiratory tract infection with no definite rhinosinusitis  criteria  Plan: See orders and recommendations.

## 2011-05-21 ENCOUNTER — Other Ambulatory Visit: Payer: Self-pay | Admitting: Internal Medicine

## 2011-05-21 DIAGNOSIS — Z1231 Encounter for screening mammogram for malignant neoplasm of breast: Secondary | ICD-10-CM

## 2011-05-22 ENCOUNTER — Ambulatory Visit (INDEPENDENT_AMBULATORY_CARE_PROVIDER_SITE_OTHER): Payer: Medicare Other

## 2011-05-22 DIAGNOSIS — Z23 Encounter for immunization: Secondary | ICD-10-CM

## 2011-05-25 ENCOUNTER — Other Ambulatory Visit: Payer: Self-pay | Admitting: Internal Medicine

## 2011-06-11 ENCOUNTER — Ambulatory Visit (HOSPITAL_COMMUNITY)
Admission: RE | Admit: 2011-06-11 | Discharge: 2011-06-11 | Disposition: A | Discharge status: Home / Self Care | Payer: Medicare Other | Source: Ambulatory Visit | Attending: Internal Medicine | Admitting: Internal Medicine

## 2011-06-11 DIAGNOSIS — Z1231 Encounter for screening mammogram for malignant neoplasm of breast: Secondary | ICD-10-CM | POA: Insufficient documentation

## 2011-07-04 ENCOUNTER — Encounter: Payer: Self-pay | Admitting: Internal Medicine

## 2011-07-04 ENCOUNTER — Ambulatory Visit (INDEPENDENT_AMBULATORY_CARE_PROVIDER_SITE_OTHER): Payer: Medicare Other | Admitting: Internal Medicine

## 2011-07-04 VITALS — BP 126/78 | HR 66 | Resp 12 | Ht 63.75 in | Wt 154.4 lb

## 2011-07-04 DIAGNOSIS — E785 Hyperlipidemia, unspecified: Secondary | ICD-10-CM

## 2011-07-04 DIAGNOSIS — M899 Disorder of bone, unspecified: Secondary | ICD-10-CM

## 2011-07-04 DIAGNOSIS — E559 Vitamin D deficiency, unspecified: Secondary | ICD-10-CM

## 2011-07-04 DIAGNOSIS — Z0000 Encounter for general adult medical examination without abnormal findings: Secondary | ICD-10-CM

## 2011-07-04 DIAGNOSIS — I10 Essential (primary) hypertension: Secondary | ICD-10-CM

## 2011-07-04 LAB — HEPATIC FUNCTION PANEL
AST: 22 U/L (ref 0–37)
Albumin: 3.7 g/dL (ref 3.5–5.2)
Alkaline Phosphatase: 39 U/L (ref 39–117)
Total Protein: 6.3 g/dL (ref 6.0–8.3)

## 2011-07-04 LAB — LIPID PANEL
Cholesterol: 227 mg/dL — ABNORMAL HIGH (ref 0–200)
Triglycerides: 90 mg/dL (ref 0.0–149.0)

## 2011-07-04 LAB — BASIC METABOLIC PANEL
Calcium: 9.4 mg/dL (ref 8.4–10.5)
Creatinine, Ser: 0.8 mg/dL (ref 0.4–1.2)

## 2011-07-04 LAB — TSH: TSH: 1.01 u[IU]/mL (ref 0.35–5.50)

## 2011-07-04 NOTE — Patient Instructions (Addendum)
Preventive Health Care: Exercise up to  30-45  minutes a day, 3-4 days a week. Walking is especially valuable in preventing Osteoporosis. Eat a low-fat diet with lots of fruits and vegetables, up to 7-9 servings per day.Consume less than 30 grams of sugar per day from foods & drinks with High Fructose Corn Syrup as # 1,2,3 or #4 on label.  The normal goal for  Vitamin D is 40-60. Vitamin D, along with calcium( 600 mg twice a day) & weight bearing exercises ( @ least 30 minutes of walking @ least 3X/ week),  is essential for bone health. Vitamin D is the # 1 cause of muscle pain in women.  Please complete stool cards

## 2011-07-04 NOTE — Progress Notes (Signed)
Subjective:    Patient ID: Denise Wagner, female    DOB: 1924-04-03, 77 y.o.   MRN: [id number]  HPI Medicare Wellness Visit:  The following psychosocial & medical history were reviewed as required by Medicare.   Social history: caffeine: 2 cups coffee , alcohol:  rarely ,  tobacco use : never  & exercise : walking in store.   Home & personal  safety / fall risk: no issues, activities of daily living: no limitations , seatbelt use : yes , and smoke alarm employment : yes .  Power of Attorney/Living Will status : in place  Vision ( as recorded per Nurse) & Hearing  evaluation :   Ophth appt 12/11; no issues. Wall chart read with lenses. Decreased hearing to whisper , R > L Orientation :oriented , memory & recall :good,  math testing: 93-7=87 ("I use a calculator"),and mood & affect : normal . Depression / anxiety: denied Travel history : never , immunization status :Shingles needed , transfusion history:  no, and preventive health surveillance ( colonoscopies, BMD , etc as per protocol/ SOC): no colonoscopy ; "it's all working", Dental care:  dentures . Chart reviewed &  Updated. Active issues reviewed & addressed.      Review of Systems HYPERTENSION: Disease Monitoring: Blood pressure range-not monitored  Chest pain, palpitations- no       Dyspnea- only mild DOE; no PNDyspnea Medications: Compliance- yes  Lightheadedness,Syncope- no    Edema- chronic  HYPERLIPIDEMIA: Disease Monitoring: See symptoms for Hypertension Medications: Compliance- no; "unable to stand up on (statin)"    ROS: Abd pain, bowel changes- no   Muscle aches- no Polyuria/phagia/dipsia-no         Objective:   Physical Exam Gen.:Appearsyounger than age;healthy and well-nourished in appearance. Alert, appropriate and cooperative throughout exam. Head: Normocephalic without obvious abnormalities  Eyes: No corneal or conjunctival inflammation noted.  Extraocular motion intact.  Ears: External  ear exam  reveals no significant lesions or deformities. Canals essentially  clear .TMs normal.  Nose: External nasal exam reveals no deformity or inflammation. Nasal mucosa are pink and moist. No lesions or exudates noted.  Mouth: Oral mucosa and oropharynx reveal no lesions or exudates. Dentures Neck: No deformities, masses, or tenderness noted. Range of motion normal. Thyroid physiologic asymmetry, R > L. Lungs: Normal respiratory effort; chest expands symmetrically. Lungs are clear to auscultation without rales, wheezes, or increased work of breathing. Heart: Normal rate and rhythm. Accentuated  S2. No gallop, click, or rub. Grade 1/6 R base murmur. Abdomen: Bowel sounds normal; abdomen soft and nontender. No masses, organomegaly or hernias noted. Genitalia: Dr Eda Paschal   .                                                                                   Musculoskeletal/extremities: No deformity or scoliosis noted of  the thoracic or lumbar spine. No clubbing, cyanosis, edema noted. Range of motion  normal .Tone & strength  Normal.Joints:OAhand changes. Nail health  good. Vascular: Carotid, radial artery, dorsalis pedis and  posterior tibial pulses are full and equal. No bruits present. Neurologic: Alert and oriented x3. Deep tendon reflexes symmetrical and normal.  Skin: Intact without suspicious lesions or rashes. Lymph: No cervical, axillary lymphadenopathy present. Psych: Mood and affect are normal. Normally interactive                                                                                         Assessment & Plan:  #1 Medicare Wellness Exam; criteria met ; data entered #2 Problem List reviewed ; Assessment/ Recommendations made  She has a long list of medication intolerances including statins as noted above. I've asked her to verify whether any of these medicines have caused rash, fever or swelling of the lips or tongue other than the doxycycline.   She does have  dyslipidemia but is statin intolerant by history. Risk will be reassessed. There is no history of premature coronary disease in the family  HTN  well-controlled at this time; she'll be asked to monitor.  She is adverse to preventive therapy intervention such as colonoscopy , statins & bisphosphonates. Stool cards will be collected .  Plan: see Orders

## 2011-07-05 LAB — VITAMIN D 25 HYDROXY (VIT D DEFICIENCY, FRACTURES): Vit D, 25-Hydroxy: 77 ng/mL (ref 30–89)

## 2011-07-17 ENCOUNTER — Other Ambulatory Visit (INDEPENDENT_AMBULATORY_CARE_PROVIDER_SITE_OTHER): Payer: Medicare Other

## 2011-07-17 DIAGNOSIS — Z1211 Encounter for screening for malignant neoplasm of colon: Secondary | ICD-10-CM

## 2011-07-17 LAB — HEMOCCULT GUIAC POC 1CARD (OFFICE): Fecal Occult Blood, POC: NEGATIVE

## 2011-11-24 ENCOUNTER — Ambulatory Visit (INDEPENDENT_AMBULATORY_CARE_PROVIDER_SITE_OTHER): Payer: Medicare Other | Admitting: Internal Medicine

## 2011-11-24 ENCOUNTER — Encounter: Payer: Self-pay | Admitting: Internal Medicine

## 2011-11-24 VITALS — BP 140/84 | HR 61 | Temp 97.7°F | Wt 155.0 lb

## 2011-11-24 DIAGNOSIS — J069 Acute upper respiratory infection, unspecified: Secondary | ICD-10-CM | POA: Diagnosis not present

## 2011-11-24 NOTE — Patient Instructions (Signed)
Rest, fluids , tylenol For cough, take Robitussin DM as needed  Continue zyrtec Call if no better in few days Call anytime if the symptoms are severe, you have high fever, short of breath

## 2011-11-24 NOTE — Progress Notes (Signed)
  Subjective:    Patient ID: Denise Wagner, female    DOB: 1924/01/05, 78 y.o.   MRN: [id number]  HPI Acute visit 2 days history of chest and head congestion, dry cough, mild headache, hoarseness. She is mostly concerned because she had chest pain. Chest pain was anterior, triggered by cough or taking a deep breath. Overall she feels much better today compared to yesterday.  Past Medical History  Diagnosis Date  . Hyperlipidemia   . Hypertension   . Osteopenia   . Vitamin d deficiency      Review of Systems No fever or chills No recent airplane trips No leg swelling other than her usual ankle edema ; no  pain in the calf.     Objective:   Physical Exam General -- alert, well-developed. No apparent distress.  HEENT -- TMs normal, throat w/o redness, face symmetric and not tender to palpation, nose not congested  Lungs -- normal respiratory effort, no intercostal retractions, no accessory muscle use, and normal breath sounds (few rhonchi with cough).   Heart-- normal rate, regular rhythm, no murmur, and no gallop.   Extremities-- no pretibial edema bilaterally , calves symmetric, no tender  Neurologic-- alert & oriented X3 and strength normal in all extremities. Psych-- Cognition and judgment appear intact. Alert and cooperative with normal attention span and concentration.  not anxious appearing and not depressed appearing.       Assessment & Plan:

## 2011-11-24 NOTE — Assessment & Plan Note (Signed)
Symptoms consistent with URI, she is already getting better. She is concerned about the chest pain but in the setting of a cold, I don't think it is cardiac in origin. Plan: see instructions

## 2011-12-18 ENCOUNTER — Encounter: Payer: Self-pay | Admitting: Family Medicine

## 2011-12-18 ENCOUNTER — Ambulatory Visit (INDEPENDENT_AMBULATORY_CARE_PROVIDER_SITE_OTHER): Payer: Medicare Other | Admitting: Family Medicine

## 2011-12-18 ENCOUNTER — Other Ambulatory Visit: Payer: Self-pay | Admitting: *Deleted

## 2011-12-18 VITALS — BP 122/70 | HR 71 | Temp 99.8°F | Wt 154.0 lb

## 2011-12-18 DIAGNOSIS — B001 Herpesviral vesicular dermatitis: Secondary | ICD-10-CM

## 2011-12-18 DIAGNOSIS — J329 Chronic sinusitis, unspecified: Secondary | ICD-10-CM

## 2011-12-18 DIAGNOSIS — B009 Herpesviral infection, unspecified: Secondary | ICD-10-CM

## 2011-12-18 MED ORDER — AZITHROMYCIN 250 MG PO TABS: ORAL_TABLET | ORAL | Status: DC

## 2011-12-18 MED ORDER — AZITHROMYCIN 250 MG PO TABS: ORAL_TABLET | ORAL | Status: AC

## 2011-12-18 MED ORDER — ACYCLOVIR 5 % EX OINT: TOPICAL_OINTMENT | CUTANEOUS | Status: AC

## 2011-12-18 NOTE — Patient Instructions (Signed)

## 2011-12-18 NOTE — Progress Notes (Signed)
  Subjective:     Denise Wagner is a 78 y.o. female who presents for evaluation of sinus pain. Symptoms include: congestion, cough, facial pain, headaches, nasal congestion, purulent rhinorrhea and sinus pressure. Onset of symptoms was 4 weeks ago. Symptoms have been gradually worsening since that time. Past history is significant for no history of pneumonia or bronchitis. Patient is a non-smoker.  The following portions of the patient's history were reviewed and updated as appropriate: allergies, current medications, past family history, past medical history, past social history, past surgical history and problem list.  Review of Systems Pertinent items are noted in HPI.   Objective:    BP 122/70  Pulse 71  Temp(Src) 99.8 F (37.7 C) (Oral)  Wt 154 lb (69.854 kg)  SpO2 98% General appearance: alert, cooperative, appears stated age and no distress Ears: normal TM's and external ear canals both ears Nose: green discharge, moderate congestion, turbinates red, swollen, sinus tenderness bilateral Throat: abnormal findings: mild oropharyngeal erythema Neck: mild anterior cervical adenopathy, supple, symmetrical, trachea midline and thyroid not enlarged, symmetric, no tenderness/mass/nodules Lungs: clear to auscultation bilaterally Heart: S1, S2 normal    Assessment:    Acute bacterial sinusitis.    Plan:    Nasal steroids per medication orders. Antihistamines per medication orders. Zithromax per medication orders. f/u prn

## 2012-02-04 ENCOUNTER — Other Ambulatory Visit: Payer: Self-pay | Admitting: Internal Medicine

## 2012-02-04 NOTE — Telephone Encounter (Signed)
Refill done.  

## 2012-04-28 ENCOUNTER — Other Ambulatory Visit: Payer: Self-pay | Admitting: Internal Medicine

## 2012-04-28 ENCOUNTER — Ambulatory Visit (INDEPENDENT_AMBULATORY_CARE_PROVIDER_SITE_OTHER): Payer: Medicare Other

## 2012-04-28 DIAGNOSIS — Z23 Encounter for immunization: Secondary | ICD-10-CM

## 2012-04-28 NOTE — Telephone Encounter (Signed)
OK , R X 5 . 1 spray bid prn

## 2012-04-28 NOTE — Telephone Encounter (Signed)
nasonex inhale to be sent  [facility] with a new script that Dr. Laury Axon gave her the last ov she had.

## 2012-04-28 NOTE — Telephone Encounter (Signed)
Dr.Hopper please advise for this is not on medication list, Dr.Lowne may have given sample. Please provide instructions

## 2012-04-29 MED ORDER — MOMETASONE FUROATE 50 MCG/ACT NA SUSP: 2.0000 | Freq: Every day | NASAL | Status: DC

## 2012-04-29 NOTE — Telephone Encounter (Signed)
Spoke with pt to verify pharmacy and advise Rx sent. Pt stated understanding.      MW

## 2012-05-26 ENCOUNTER — Other Ambulatory Visit: Payer: Self-pay | Admitting: Internal Medicine

## 2012-05-26 DIAGNOSIS — Z1231 Encounter for screening mammogram for malignant neoplasm of breast: Secondary | ICD-10-CM

## 2012-06-16 ENCOUNTER — Ambulatory Visit (HOSPITAL_COMMUNITY)
Admission: RE | Admit: 2012-06-16 | Discharge: 2012-06-16 | Disposition: A | Discharge status: Home / Self Care | Payer: Medicare Other | Source: Ambulatory Visit | Attending: Internal Medicine | Admitting: Internal Medicine

## 2012-06-16 DIAGNOSIS — Z1231 Encounter for screening mammogram for malignant neoplasm of breast: Secondary | ICD-10-CM | POA: Diagnosis not present

## 2012-06-21 DIAGNOSIS — M1710 Unilateral primary osteoarthritis, unspecified knee: Secondary | ICD-10-CM | POA: Diagnosis not present

## 2012-06-29 DIAGNOSIS — M1710 Unilateral primary osteoarthritis, unspecified knee: Secondary | ICD-10-CM | POA: Diagnosis not present

## 2012-07-06 DIAGNOSIS — M1710 Unilateral primary osteoarthritis, unspecified knee: Secondary | ICD-10-CM | POA: Diagnosis not present

## 2012-07-12 DIAGNOSIS — H52209 Unspecified astigmatism, unspecified eye: Secondary | ICD-10-CM | POA: Diagnosis not present

## 2012-07-12 DIAGNOSIS — H04129 Dry eye syndrome of unspecified lacrimal gland: Secondary | ICD-10-CM | POA: Diagnosis not present

## 2012-07-14 ENCOUNTER — Encounter: Payer: Self-pay | Admitting: Internal Medicine

## 2012-07-14 ENCOUNTER — Ambulatory Visit (INDEPENDENT_AMBULATORY_CARE_PROVIDER_SITE_OTHER): Payer: Medicare Other | Admitting: Internal Medicine

## 2012-07-14 VITALS — BP 124/70 | Temp 97.6°F | Resp 12 | Ht 64.0 in | Wt 151.6 lb

## 2012-07-14 DIAGNOSIS — E559 Vitamin D deficiency, unspecified: Secondary | ICD-10-CM

## 2012-07-14 DIAGNOSIS — Z0000 Encounter for general adult medical examination without abnormal findings: Secondary | ICD-10-CM

## 2012-07-14 DIAGNOSIS — E785 Hyperlipidemia, unspecified: Secondary | ICD-10-CM | POA: Diagnosis not present

## 2012-07-14 DIAGNOSIS — M949 Disorder of cartilage, unspecified: Secondary | ICD-10-CM

## 2012-07-14 DIAGNOSIS — Z23 Encounter for immunization: Secondary | ICD-10-CM | POA: Diagnosis not present

## 2012-07-14 DIAGNOSIS — I10 Essential (primary) hypertension: Secondary | ICD-10-CM | POA: Diagnosis not present

## 2012-07-14 DIAGNOSIS — M899 Disorder of bone, unspecified: Secondary | ICD-10-CM

## 2012-07-14 LAB — HEPATIC FUNCTION PANEL
AST: 19 U/L (ref 0–37)
Albumin: 3.7 g/dL (ref 3.5–5.2)
Total Bilirubin: 0.9 mg/dL (ref 0.3–1.2)

## 2012-07-14 LAB — BASIC METABOLIC PANEL
BUN: 15 mg/dL (ref 6–23)
Calcium: 9.6 mg/dL (ref 8.4–10.5)
Creatinine, Ser: 0.8 mg/dL (ref 0.4–1.2)
GFR: 74.4 mL/min (ref >60.00)
Potassium: 3.6 mEq/L (ref 3.5–5.1)

## 2012-07-14 LAB — LIPID PANEL
Cholesterol: 229 mg/dL — ABNORMAL HIGH (ref 0–200)
HDL: 51 mg/dL (ref >39.00)
Total CHOL/HDL Ratio: 4
Triglycerides: 97 mg/dL (ref 0.0–149.0)
VLDL: 19.4 mg/dL (ref 0.0–40.0)

## 2012-07-14 NOTE — Patient Instructions (Addendum)
Preventive Health Care: Exercise up to  30-45  minutes a day, 3-4 days a week. Walking is especially valuable in preventing Osteoporosis. Eat a low-fat diet with lots of fruits and vegetables, up to 7-9 servings per day. Consume less than 30 grams of sugar per day from foods & drinks with High Fructose Corn Syrup as #1,2,3 or #4 on label. Blood Pressure Goal  Ideally is an AVERAGE < 135/85. This AVERAGE should be calculated from @ least 5-7 BP readings taken @ different times of day on different days of week. You should not respond to isolated BP readings , but rather the AVERAGE for that week

## 2012-07-14 NOTE — Progress Notes (Signed)
Subjective:    Patient ID: Denise Wagner, female    DOB: 24-Feb-1924, 78 y.o.   MRN: [id number]  HPI Medicare Wellness Visit:  The following psychosocial & medical history were reviewed as required by Medicare.   Social history: caffeine: 1.5 cups coffee , alcohol:  rarely ,  tobacco use : never  & exercise : walking @ store.   Home & personal  safety / fall risk: no issues, activities of daily living: no limitations , seatbelt use : yes , and smoke alarm employment : yes .  Power of Attorney/Living Will status : in place  Vision ( as recorded per Nurse) & Hearing  evaluation : Ophth exam 07/12/12. No hearing exam Orientation :oriented X3 , memory & recall :good, spelling  testing: good,and mood & affect :normal  . Depression / anxiety: denied Travel history : never , immunization status :Shingles needed , transfusion history:  no, and preventive health surveillance ( colonoscopies, BMD , etc as per protocol/ SOC): never had colonoscopy; "just haven't had it" (SOC reviewed) , Dental care:  Complete dentures . Chart reviewed &  Updated. Active issues reviewed & addressed.      Review of Systems HYPERTENSION: Disease Monitoring: Blood pressure range- cuff broken  Chest pain, palpitations- no      Dyspnea- no Medications: Compliance- yes  Lightheadedness,Syncope- no Edema-chronic  HYPERLIPIDEMIA: Disease Monitoring: See symptoms for Hypertension Medications: Compliance- Pravastatin D/Ced 12/09/2010 due to leg weakness  Abd pain, bowel changes- no   Muscle aches- improved         Objective:   Physical Exam Gen.:  well-nourished in appearance. Alert, appropriate and cooperative throughout exam.Appears younger than stated age  Head: Normocephalic without obvious abnormalities  Eyes: No corneal or conjunctival inflammation noted. Pupils equal round reactive to light and accommodation. Fundal exam is benign without hemorrhages, exudate, papilledema. Extraocular motion intact.  Vision grossly normal. Ears: External  ear exam reveals no significant lesions or deformities. Canals clear .TMs normal. Hearing is grossly decreased bilaterally. Nose: External nasal exam reveals no deformity or inflammation. Nasal mucosa are pink and moist. No lesions or exudates noted.   Mouth: Oral mucosa and oropharynx reveal no lesions or exudates. Dentures. Neck: No deformities, masses, or tenderness noted. Range of motion & Thyroid normal. Lungs: Normal respiratory effort; chest expands symmetrically. Lungs are clear to auscultation without rales, wheezes, or increased work of breathing. Heart: Normal rate and rhythm. Normal S1 and S2. No gallop, click, or rub. Grade 1/6 systolic murmur  Abdomen: Bowel sounds normal; abdomen soft and nontender. No masses, organomegaly or hernias noted. Genitalia: she has D/ced Gyn F/U Musculoskeletal/extremities: There is some asymmetry of the posterior thoracic musculature suggesting occult scoliosis. Degenerative joint changes of fingers present. Fusiform changes the knees with crepitus and decreased range of motion Vascular: Carotid, radial artery, dorsalis pedis and  posterior tibial pulses are full and equal. No bruits present; radiation of right basilar murmur into the right carotid. Neurologic: Alert and oriented x3. Deep tendon reflexes symmetrical and normal.          Skin: Intact without suspicious lesions or rashes. Lymph: No cervical, axillary lymphadenopathy present. Psych: Mood and affect are normal. Normally interactive  Assessment & Plan:  #1 Medicare Wellness Exam; criteria met ; data entered #2 Problem List reviewed ; Assessment/ Recommendations made Plan: see Orders

## 2012-08-02 ENCOUNTER — Other Ambulatory Visit: Payer: Self-pay | Admitting: Internal Medicine

## 2013-05-17 ENCOUNTER — Other Ambulatory Visit: Payer: Self-pay | Admitting: Internal Medicine

## 2013-05-17 DIAGNOSIS — Z1231 Encounter for screening mammogram for malignant neoplasm of breast: Secondary | ICD-10-CM

## 2013-05-25 ENCOUNTER — Ambulatory Visit (INDEPENDENT_AMBULATORY_CARE_PROVIDER_SITE_OTHER): Payer: Medicare Other

## 2013-05-25 DIAGNOSIS — Z23 Encounter for immunization: Secondary | ICD-10-CM

## 2013-06-17 ENCOUNTER — Ambulatory Visit (HOSPITAL_COMMUNITY)
Admission: RE | Admit: 2013-06-17 | Discharge: 2013-06-17 | Disposition: A | Discharge status: Home / Self Care | Payer: Medicare Other | Source: Ambulatory Visit | Attending: Internal Medicine | Admitting: Internal Medicine

## 2013-06-17 DIAGNOSIS — Z1231 Encounter for screening mammogram for malignant neoplasm of breast: Secondary | ICD-10-CM | POA: Insufficient documentation

## 2013-07-15 ENCOUNTER — Other Ambulatory Visit: Payer: Self-pay | Admitting: Internal Medicine

## 2013-07-15 NOTE — Telephone Encounter (Signed)
HCTZ and Metoprolol refilled per protocol. JG//CMA

## 2013-07-18 DIAGNOSIS — H52209 Unspecified astigmatism, unspecified eye: Secondary | ICD-10-CM | POA: Diagnosis not present

## 2013-07-18 DIAGNOSIS — Z961 Presence of intraocular lens: Secondary | ICD-10-CM | POA: Diagnosis not present

## 2013-07-18 DIAGNOSIS — H04129 Dry eye syndrome of unspecified lacrimal gland: Secondary | ICD-10-CM | POA: Diagnosis not present

## 2013-07-19 ENCOUNTER — Encounter: Payer: Medicare Other | Admitting: Internal Medicine

## 2013-08-24 ENCOUNTER — Telehealth: Payer: Self-pay

## 2013-08-24 NOTE — Telephone Encounter (Signed)
Medication and allergies:  Reviewed and updated  90 day supply/mail order:  Local pharmacy: [facility] [address]   Immunizations due:  Shnigles  A/P:   No changes to FH, PSH or Personal Hx Flu vaccine--04/2013 Tdap--06/2012 PNA--07/1999 Shingles--unsure of insurance with pay for Pap--no further gyn follow ups [facility]--04/2013--neg Bone Density--none noted [facility]--never had  To Discuss with Provider: May possibly get shingles vaccine/will check for coverage with insurance

## 2013-08-26 ENCOUNTER — Encounter: Payer: Self-pay | Admitting: Internal Medicine

## 2013-08-26 ENCOUNTER — Ambulatory Visit (INDEPENDENT_AMBULATORY_CARE_PROVIDER_SITE_OTHER): Payer: Medicare Other | Admitting: Internal Medicine

## 2013-08-26 VITALS — BP 154/75 | HR 63 | Temp 98.0°F | Ht 64.25 in | Wt 151.4 lb

## 2013-08-26 DIAGNOSIS — E559 Vitamin D deficiency, unspecified: Secondary | ICD-10-CM

## 2013-08-26 DIAGNOSIS — E785 Hyperlipidemia, unspecified: Secondary | ICD-10-CM

## 2013-08-26 DIAGNOSIS — M899 Disorder of bone, unspecified: Secondary | ICD-10-CM | POA: Diagnosis not present

## 2013-08-26 DIAGNOSIS — Z0000 Encounter for general adult medical examination without abnormal findings: Secondary | ICD-10-CM | POA: Diagnosis not present

## 2013-08-26 DIAGNOSIS — M949 Disorder of cartilage, unspecified: Secondary | ICD-10-CM | POA: Diagnosis not present

## 2013-08-26 DIAGNOSIS — I10 Essential (primary) hypertension: Secondary | ICD-10-CM

## 2013-08-26 LAB — LIPID PANEL
CHOL/HDL RATIO: 4
Cholesterol: 220 mg/dL — ABNORMAL HIGH (ref 0–200)
HDL: 54.2 mg/dL (ref >39.00)
TRIGLYCERIDES: 114 mg/dL (ref 0.0–149.0)
VLDL: 22.8 mg/dL (ref 0.0–40.0)

## 2013-08-26 LAB — HEPATIC FUNCTION PANEL
ALK PHOS: 38 U/L — AB (ref 39–117)
ALT: 15 U/L (ref 0–35)
AST: 21 U/L (ref 0–37)
Albumin: 3.6 g/dL (ref 3.5–5.2)
BILIRUBIN DIRECT: 0 mg/dL (ref 0.0–0.3)
BILIRUBIN TOTAL: 1.1 mg/dL (ref 0.3–1.2)
Total Protein: 6.3 g/dL (ref 6.0–8.3)

## 2013-08-26 LAB — TSH: TSH: 1.61 u[IU]/mL (ref 0.35–5.50)

## 2013-08-26 LAB — BASIC METABOLIC PANEL
BUN: 13 mg/dL (ref 6–23)
CHLORIDE: 104 meq/L (ref 96–112)
CO2: 29 mEq/L (ref 19–32)
CREATININE: 0.8 mg/dL (ref 0.4–1.2)
Calcium: 9.6 mg/dL (ref 8.4–10.5)
GFR: 76.46 mL/min (ref >60.00)
Glucose, Bld: 91 mg/dL (ref 70–99)
POTASSIUM: 3.8 meq/L (ref 3.5–5.1)
SODIUM: 138 meq/L (ref 135–145)

## 2013-08-26 LAB — LDL CHOLESTEROL, DIRECT: Direct LDL: 152.8 mg/dL

## 2013-08-26 MED ORDER — FLUTICASONE PROPIONATE 50 MCG/ACT NA SUSP: 1.0000 | Freq: Two times a day (BID) | NASAL | Status: DC | PRN

## 2013-08-26 NOTE — Progress Notes (Signed)
Pre visit review using our clinic review tool, if applicable. No additional management support is needed unless otherwise documented below in the visit note. 

## 2013-08-26 NOTE — Patient Instructions (Addendum)
Your next office appointment will be determined based upon review of your pending labs &/ or x-rays. Those instructions will be transmitted to you through My Chart  or by mail if you're not using this system.   Minimal Blood Pressure Goal= AVERAGE < 150/90;  Ideal is an AVERAGE < 140/85. This AVERAGE should be calculated from @ least 5-7 BP readings taken @ different times of day on different days of week. You should not respond to isolated BP readings , but rather the AVERAGE for that week .Please bring your  blood pressure cuff to office visits to verify that it is reliable.It  can also be checked against the blood pressure device at the pharmacy. Finger or wrist cuffs are not dependable; an arm cuff is.  Plain Mucinex (NOT D) for thick secretions ;force NON dairy fluids .   Nasal cleansing in the shower as discussed with lather of mild shampoo.After 10 seconds wash off lather while  exhaling through nostrils. Make sure that all residual soap is removed to prevent irritation.  Fluticasone 1 spray in each nostril twice a day as needed. Use the "crossover" technique into opposite nostril spraying toward opposite ear @ 45 degree angle, not straight up into nostril.  Use a Neti pot daily only  as needed for significant sinus congestion; going from open side to congested side . Plain Allegra (NOT D )  160 daily , Loratidine 10 mg , OR Zyrtec 10 mg @ bedtime  as needed for itchy eyes & sneezing.

## 2013-08-26 NOTE — Progress Notes (Signed)
Subjective:    Patient ID: Denise Wagner, female    DOB: 1923/10/12, 80 y.o.   MRN: [id number]  HPI Medicare Wellness Visit: Psychosocial and medical history were reviewed as required by Medicare (history related to abuse, antisocial behavior , firearm risk). Social history: Caffeine: 2 cups / day  , Alcohol:rarely  , Tobacco ATF:TDDUK Exercise:walking 20 min 2-3 X/week Personal safety/fall risk:no Limitations of activities of daily living:no Seatbelt/ smoke alarm use:yes Healthcare Power of Attorney/Living Will status: in place Ophthalmologic exam status:UTD  Hearing evaluation status:not UTD Orientation: Oriented X3 Memory and recall: good Spelling or math testing: declined Depression/anxiety assessment: no Foreign travel history:never Immunization status for influenza/pneumonia/ shingles /tetanus:Shingles Transfusion history:no Preventive health care maintenance status: Colonoscopy/BMD/mammogram/Pap as per protocol/standard care:not current .Colonoscopy declined.Mammogram UTD Dental care:dentures Chart reviewed and updated. Active issues reviewed and addressed as documented below.    Review of Systems  Blood pressure range / average not monitored Compliant with anti hypertemsive medication. No lightheadedness or other adverse medication effect described.  Significant headaches, epistaxis, chest pain, palpitations, exertional dyspnea, claudication, or paroxysmal nocturnal dyspnea.Constant edema absent.      Objective:   Physical Exam  Gen.:  well-nourished in appearance. Alert, appropriate and cooperative throughout exam. 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 Pupils equal round reactive to light and accommodation. Extraocular motion intact.  Ears: External  ear exam reveals no significant lesions or deformities. Canals clear .TMs normal. Hearing is grossly normal  bilaterally. Nose: External nasal exam reveals no deformity or inflammation. Nasal mucosa are pink and moist. No lesions or exudates noted.   Mouth: Oral mucosa and oropharynx reveal no lesions or exudates. Dentures. Neck: No deformities, masses, or tenderness noted. Range of motion good. Thyroid normal. Lungs: Normal respiratory effort; chest expands symmetrically. Lungs are clear to auscultation without rales, wheezes, or increased work of breathing. Heart: Normal rate and rhythm. Normal S1 and S2. No gallop, click, or rub. Grade 0/2-5 over 6 systolic murmur. Abdomen: Bowel sounds normal; abdomen soft and nontender. No masses, organomegaly or hernias noted. Genitalia:  as per Gyn                                  Musculoskeletal/extremities:  Accentuated curvature of  thoracic spine. No clubbing, cyanosis, edema, or significant extremity  deformity noted. Lipedema @ ankles.Range of motion normal .Tone & strength normal. Hand joints  reveal DJD DIP changes. Fingernail health good. Crepitus knees Able to lie down & sit up w/o help. Negative SLR bilaterally Vascular: Carotid, radial artery, dorsalis pedis and  posterior tibial pulses are full and equal. No bruits present. Neurologic: Alert and oriented x3. Deep tendon reflexes symmetrical and normal.  Skin: Intact without suspicious lesions or rashes. Lymph: No cervical, axillary lymphadenopathy present. Psych: Mood and affect are normal. Normally interactive                                                                                        Assessment & Plan:  #1  Medicare Wellness Exam; criteria met ; data entered #2 Problem List/Diagnoses reviewed Plan:  Assessments made/ Orders entered

## 2013-08-29 ENCOUNTER — Encounter: Payer: Self-pay | Admitting: *Deleted

## 2013-08-30 LAB — VITAMIN D 1,25 DIHYDROXY
Vitamin D 1, 25 (OH)2 Total: 23 pg/mL (ref 18–72)
Vitamin D3 1, 25 (OH)2: 23 pg/mL

## 2013-08-31 ENCOUNTER — Other Ambulatory Visit: Payer: Self-pay | Admitting: Internal Medicine

## 2013-08-31 DIAGNOSIS — E559 Vitamin D deficiency, unspecified: Secondary | ICD-10-CM

## 2013-09-01 ENCOUNTER — Encounter: Payer: Self-pay | Admitting: General Practice

## 2013-10-13 ENCOUNTER — Other Ambulatory Visit: Payer: Self-pay | Admitting: Internal Medicine

## 2013-12-01 DIAGNOSIS — M1710 Unilateral primary osteoarthritis, unspecified knee: Secondary | ICD-10-CM | POA: Diagnosis not present

## 2013-12-12 DIAGNOSIS — M1710 Unilateral primary osteoarthritis, unspecified knee: Secondary | ICD-10-CM | POA: Diagnosis not present

## 2013-12-20 DIAGNOSIS — M1710 Unilateral primary osteoarthritis, unspecified knee: Secondary | ICD-10-CM | POA: Diagnosis not present

## 2014-02-02 ENCOUNTER — Encounter: Payer: Self-pay | Admitting: Family Medicine

## 2014-02-02 ENCOUNTER — Ambulatory Visit (INDEPENDENT_AMBULATORY_CARE_PROVIDER_SITE_OTHER): Payer: Medicare Other | Admitting: Family Medicine

## 2014-02-02 VITALS — BP 116/78 | HR 64 | Temp 97.9°F | Resp 16 | Wt 152.2 lb

## 2014-02-02 DIAGNOSIS — J0100 Acute maxillary sinusitis, unspecified: Secondary | ICD-10-CM

## 2014-02-02 MED ORDER — AZITHROMYCIN 250 MG PO TABS: ORAL_TABLET | ORAL | Status: DC

## 2014-02-02 NOTE — Progress Notes (Signed)
Pre visit review using our clinic review tool, if applicable. No additional management support is needed unless otherwise documented below in the visit note. 

## 2014-02-02 NOTE — Progress Notes (Signed)
   Subjective:    Patient ID: Denise Wagner, female    DOB: Nov 07, 1923, 80 y.o.   MRN: [id number]  Headache  Associated symptoms include coughing.  Sinusitis Associated symptoms include coughing and headaches.  Cough Associated symptoms include headaches.   URI- pt reports she has not had recent issues since starting Zyrtec 5 yrs ago.  [place] the beach and had a vent directly over head.  sxs started 5 days ago.  + nasal congestion, HAs, sneezing.  Advil improved HA.  + sinus pressure.  No ear pain.  No N/V/D.  No fevers.  No known sick contacts.   Review of Systems  Respiratory: Positive for cough.   Neurological: Positive for headaches.   For ROS see HPI     Objective:   Physical Exam  Vitals reviewed. Constitutional: She appears well-developed and well-nourished. No distress.  HENT:  Head: Normocephalic and atraumatic.  Right Ear: Tympanic membrane normal.  Left Ear: Tympanic membrane normal.  Nose: Mucosal edema and rhinorrhea present. Right sinus exhibits maxillary sinus tenderness. Right sinus exhibits no frontal sinus tenderness. Left sinus exhibits maxillary sinus tenderness. Left sinus exhibits no frontal sinus tenderness.  Mouth/Throat: Uvula is midline and mucous membranes are normal. Posterior oropharyngeal erythema present. No oropharyngeal exudate.  Eyes: Conjunctivae and EOM are normal. Pupils are equal, round, and reactive to light.  Neck: Normal range of motion. Neck supple.  Cardiovascular: Normal rate, regular rhythm and normal heart sounds.   Pulmonary/Chest: Effort normal and breath sounds normal. No respiratory distress. She has no wheezes.  Lymphadenopathy:    She has no cervical adenopathy.          Assessment & Plan:

## 2014-02-02 NOTE — Patient Instructions (Signed)
Follow up as needed Start the Zpack as directed for an early sinus infection Continue the Zyrtec daily Drink plenty of fluids REST! Call with any questions or concerns Hang in there!!!

## 2014-02-03 NOTE — Assessment & Plan Note (Signed)
Pt's sxs and PE consistent w/ infxn.  Start abx.  Reviewed supportive care and red flags that should prompt return.  Pt expressed understanding and is in agreement w/ plan.  

## 2014-03-13 ENCOUNTER — Ambulatory Visit (INDEPENDENT_AMBULATORY_CARE_PROVIDER_SITE_OTHER): Payer: Medicare Other | Admitting: Physician Assistant

## 2014-03-13 ENCOUNTER — Encounter: Payer: Self-pay | Admitting: Physician Assistant

## 2014-03-13 VITALS — BP 120/76 | HR 64 | Temp 98.5°F | Resp 18 | Wt 153.0 lb

## 2014-03-13 DIAGNOSIS — J069 Acute upper respiratory infection, unspecified: Secondary | ICD-10-CM

## 2014-03-13 NOTE — Progress Notes (Signed)
Subjective:    Patient ID: Denise Wagner, female    DOB: [birth year], 80 y.o.   MRN: [id number]  Sinusitis This is a new problem. The current episode started in the past 7 days (3 days). The problem has been gradually improving since onset. There has been no fever. She is experiencing no pain. Associated symptoms include chills (better), congestion, coughing (clear), ear pain (resolving), headaches, a hoarse voice, sneezing and a sore throat (better today). Pertinent negatives include no diaphoresis, neck pain, shortness of breath, sinus pressure or swollen glands. Treatments tried: zyrtec, emergen-C. The treatment provided mild relief.      Review of Systems  Constitutional: Positive for chills (better). Negative for fever and diaphoresis.  HENT: Positive for congestion, ear pain (resolving), hoarse voice, sneezing and sore throat (better today). Negative for postnasal drip and sinus pressure.   Respiratory: Positive for cough (clear). Negative for shortness of breath.   Cardiovascular: Negative for chest pain.  Gastrointestinal: Negative for nausea and vomiting.  Musculoskeletal: Negative for neck pain.  Neurological: Positive for headaches. Negative for syncope.  All other systems reviewed and are negative.    Past Medical History  Diagnosis Date  . Hyperlipidemia   . Hypertension   . Osteopenia   . Vitamin D deficiency     History   Social History  . Marital Status: Widowed    Spouse Name: N/A    Number of Children: N/A  . Years of Education: N/A   Occupational History  . retired    Social History Main Topics  . Smoking status: Never Smoker   . Smokeless tobacco: Not on file  . Alcohol Use: Yes     Comment: rarely  . Drug Use: No  . Sexual Activity: Not on file   Other Topics Concern  . Not on file   Social History Narrative   No diet          Past Surgical History  Procedure Laterality Date  . Hemorrhoid surgery    . Gravida 0,para 0,mis 0,  dr.gottsegen    . Synvisc injections; r knee      Dr Lynann Bologna  . Cataract surgery  2007    bilaterally  . No colonoscopy      "I never had a problem" (SOC)    Family History  Problem Relation Age of Onset  . Arthritis Mother   . Heart disease Brother     MI in 35's  . Diabetes Maternal Grandmother   . Heart disease Cousin     MI    Allergies  Allergen Reactions  . Doxycycline     REACTION: hives  . Metronidazole   . Penicillins   . Propoxyphene N-Acetaminophen   . Sulfonamide Derivatives   . Pravastatin     11/2010 Leg weakness     Current Outpatient Prescriptions on File Prior to Visit  Medication Sig Dispense Refill  . aspirin 81 MG tablet Take 81 mg by mouth daily.        Marland Kitchen azithromycin (ZITHROMAX) 250 MG tablet 2 tabs on day 1, 1 tab on day 2-5  6 tablet  0  . Calcium Carbonate-Vit D-Min 1200-1000 MG-UNIT CHEW Chew 1,200 Int'l Units by mouth 2 (two) times daily.        . cetirizine (ZYRTEC ALLERGY) 10 MG tablet Take 10 mg by mouth as needed.        . Cholecalciferol (VITAMIN D3) 1000 UNITS CAPS Take 1,000 mg by mouth daily.        Marland Kitchen  fluticasone (FLONASE) 50 MCG/ACT nasal spray Place 1 spray into both nostrils 2 (two) times daily as needed for allergies or rhinitis.  48 g  1  . hydrochlorothiazide (HYDRODIURIL) 12.5 MG tablet TAKE 1 TABLET DAILY  90 tablet  3  . L-Lysine 500 MG TABS Take 500 mg by mouth daily.        . metoprolol tartrate (LOPRESSOR) 25 MG tablet TAKE 1 TABLET TWICE A DAY  180 tablet  3  . Multiple Vitamins-Calcium (VIACTIV MULTI-VITAMIN PO) Take by mouth daily.        . Omega-3 Fatty Acids (FISH OIL) 1000 MG CAPS Take 1,000 mg by mouth. Take two daily       . vitamin C (ASCORBIC ACID) 500 MG tablet Take 500 mg by mouth daily. LIQUID GELS...1 by mouth once daily       . vitamin E (VITAMIN E) 400 UNIT capsule Take 400 Units by mouth daily.         No current facility-administered medications on file prior to visit.    EXAM: BP 120/76  Pulse 64   Temp(Src) 98.5 F (36.9 C) (Oral)  Resp 18  Wt 153 lb (69.4 kg)  SpO2 98%     Objective:   Physical Exam  Nursing note and vitals reviewed. Constitutional: She is oriented to person, place, and time. She appears well-developed and well-nourished. No distress.  HENT:  Head: Normocephalic and atraumatic.  Right Ear: External ear normal.  Left Ear: External ear normal.  Nose: Nose normal.  Mouth/Throat: No oropharyngeal exudate.  Oropharynx is slightly erythematous, no exudate. Bilateral TMs normal. Bilateral frontal and maxillary sinuses non-TTP.   Eyes: Conjunctivae and EOM are normal. Pupils are equal, round, and reactive to light.  Neck: Normal range of motion. Neck supple.  Cardiovascular: Normal rate, regular rhythm and intact distal pulses.   Pulmonary/Chest: Effort normal and breath sounds normal. No stridor. No respiratory distress. She has no wheezes. She has no rales. She exhibits no tenderness.  Lymphadenopathy:    She has no cervical adenopathy.  Neurological: She is alert and oriented to person, place, and time.  Skin: Skin is warm and dry. She is not diaphoretic.  Psychiatric: She has a normal mood and affect. Her behavior is normal. Judgment and thought content normal.    Lab Results  Component Value Date   WBC 5.8 09/27/2009   HGB 14.0 09/27/2009   HCT 41.9 09/27/2009   PLT 189.0 09/27/2009   GLUCOSE 91 08/26/2013   CHOL 220* 08/26/2013   TRIG 114.0 08/26/2013   HDL 54.20 08/26/2013   LDLDIRECT 152.8 08/26/2013   LDLCALC 105* 06/04/2010   ALT 15 08/26/2013   AST 21 08/26/2013   NA 138 08/26/2013   K 3.8 08/26/2013   CL 104 08/26/2013   CREATININE 0.8 08/26/2013   BUN 13 08/26/2013   CO2 29 08/26/2013   TSH 1.61 08/26/2013   HGBA1C 5.5 10/24/2008         Assessment & Plan:  Denise Wagner was seen today for sore throat.  Diagnoses and associated orders for this visit:  Acute upper respiratory infections of unspecified site Comments: Conservative therapy, otc Mucinex,  nasal steroid, antihistamine, saline rinse, rest, fluid hydration, watchful waiting.    Return precautions provided, and patient handout on URI.  Plan to follow up as needed, or for worsening or persistent symptoms despite treatment.  Patient Instructions  Antibiotics within the first 10 days of and illness are generally not beneficial, and do not usually  decrease the length of time of illness.  Plain Over the Counter Mucinex (NOT Mucinex D) for thick secretions  Force NON dairy fluids, drinking plenty of water is best.    Over the Counter Flonase OR Nasacort AQ 1 spray in each nostril twice a day as needed. Use the "crossover" technique into opposite nostril spraying toward opposite ear @ 45 degree angle, not straight up into nostril.   Plain Over the Counter Allegra (NOT D )  160 daily , OR Loratidine 10 mg , OR Zyrtec 10 mg @ bedtime  as needed for itchy eyes & sneezing.  Saline Irrigation and Saline Sprays can also help reduce symptoms.  If emergency symptoms discussed during visit developed, seek medical attention immediately.  Followup as needed, or for worsening or persistent symptoms despite treatment.

## 2014-03-13 NOTE — Progress Notes (Signed)
Pre visit review using our clinic review tool, if applicable. No additional management support is needed unless otherwise documented below in the visit note. 

## 2014-03-13 NOTE — Patient Instructions (Addendum)
Antibiotics within the first 10 days of and illness are generally not beneficial, and do not usually decrease the length of time of illness.  Plain Over the Counter Mucinex (NOT Mucinex D) for thick secretions  Force NON dairy fluids, drinking plenty of water is best.    Over the Counter Flonase OR Nasacort AQ 1 spray in each nostril twice a day as needed. Use the "crossover" technique into opposite nostril spraying toward opposite ear @ 45 degree angle, not straight up into nostril.   Plain Over the Counter Allegra (NOT D )  160 daily , OR Loratidine 10 mg , OR Zyrtec 10 mg @ bedtime  as needed for itchy eyes & sneezing.  Saline Irrigation and Saline Sprays can also help reduce symptoms.  If emergency symptoms discussed during visit developed, seek medical attention immediately.  Followup as needed, or for worsening or persistent symptoms despite treatment.    Upper Respiratory Infection, Adult An upper respiratory infection (URI) is also known as the common cold. It is often caused by a type of germ (virus). Colds are easily spread (contagious). You can pass it to others by kissing, coughing, sneezing, or drinking out of the same glass. Usually, you get better in 1 or 2 weeks.  HOME CARE   Only take medicine as told by your doctor.  Use a warm mist humidifier or breathe in steam from a hot shower.  Drink enough water and fluids to keep your pee (urine) clear or pale yellow.  Get plenty of rest.  Return to work when your temperature is back to normal or as told by your doctor. You may use a face mask and wash your hands to stop your cold from spreading. GET HELP RIGHT AWAY IF:   After the first few days, you feel you are getting worse.  You have questions about your medicine.  You have chills, shortness of breath, or brown or red spit (mucus).  You have yellow or brown snot (nasal discharge) or pain in the face, especially when you bend forward.  You have a fever, puffy  (swollen) neck, pain when you swallow, or white spots in the back of your throat.  You have a bad headache, ear pain, sinus pain, or chest pain.  You have a high-pitched whistling sound when you breathe in and out (wheezing).  You have a lasting cough or cough up blood.  You have sore muscles or a stiff neck. MAKE SURE YOU:   Understand these instructions.  Will watch your condition.  Will get help right away if you are not doing well or get worse. Document Released: 12/31/2007 Document Revised: 10/06/2011 Document Reviewed: 10/19/2013 [facility] Patient Information 2015 [place], [place]. This information is not intended to replace advice given to you by your health care provider. Make sure you discuss any questions you have with your health care provider.

## 2014-04-04 ENCOUNTER — Telehealth: Payer: Self-pay | Admitting: Internal Medicine

## 2014-04-04 MED ORDER — AZITHROMYCIN 250 MG PO TABS: ORAL_TABLET | ORAL | Status: DC

## 2014-04-04 NOTE — Telephone Encounter (Signed)
Pt seen matt 8/17 and not states her cold has turned to sinus inf. W/ Yellow/ green mucas  . Pt prefers not to make appt, would like a zpak sent to  [facility] [place]/ [place] Pt states she could not take the mucinex.  Made her have nightmares, confusion.

## 2014-04-04 NOTE — Telephone Encounter (Signed)
If she is amenable to this, please Rx Ceftin 250mg , BID #20. If she feels like she does not tolerate this medication, urge ROV.

## 2014-04-04 NOTE — Telephone Encounter (Signed)
Called and spoke with pt and pt states it has been a long time since she had penicillin and she does not know what it did to her.  Per Rodman Key send in [place]. Pt is aware it is sent to pharmacy.

## 2014-04-04 NOTE — Telephone Encounter (Signed)
Pls advise.  

## 2014-04-04 NOTE — Telephone Encounter (Signed)
Please call pt. I feel like a course of Ceftin would likely be more beneficial for her than a Zpack, if she has been sick for the last 3 weeks. This would be twice daily for 10 days.

## 2014-05-02 ENCOUNTER — Telehealth: Payer: Self-pay | Admitting: Internal Medicine

## 2014-05-02 MED ORDER — PNEUMOCOCCAL VAC POLYVALENT 25 MCG/0.5ML IJ INJ: 0.5000 mL | INJECTION | Freq: Once | INTRAMUSCULAR | Status: DC

## 2014-05-02 NOTE — Telephone Encounter (Signed)
OK but @ least 1 week apart Thanks for doing Rx for PNA vaccine

## 2014-05-02 NOTE — Telephone Encounter (Signed)
ok 

## 2014-05-02 NOTE — Telephone Encounter (Signed)
Patient also needs a prescription for the shingles injection.

## 2014-05-02 NOTE — Telephone Encounter (Signed)
Called pt no answer LMOM sent rx to [facility] for pneumonia injection...Denise Wagner

## 2014-05-02 NOTE — Telephone Encounter (Signed)
Patient needs a prescription to get a pneumonia shot at a drug store.

## 2014-05-02 NOTE — Telephone Encounter (Signed)
ERROR

## 2014-05-03 MED ORDER — ZOSTER VACCINE LIVE 19400 UNT/0.65ML ~~LOC~~ SOLR: 0.6500 mL | Freq: Once | SUBCUTANEOUS | Status: DC

## 2014-05-03 NOTE — Addendum Note (Signed)
Addended by: Earnstine Regal on: 05/03/2014 08:55 AM   Modules accepted: Orders

## 2014-05-03 NOTE — Telephone Encounter (Signed)
Notified pt with md response.../lmb 

## 2014-05-05 ENCOUNTER — Other Ambulatory Visit: Payer: Self-pay | Admitting: Internal Medicine

## 2014-05-05 ENCOUNTER — Ambulatory Visit (INDEPENDENT_AMBULATORY_CARE_PROVIDER_SITE_OTHER): Payer: Medicare Other

## 2014-05-05 DIAGNOSIS — Z23 Encounter for immunization: Secondary | ICD-10-CM

## 2014-06-09 ENCOUNTER — Other Ambulatory Visit: Payer: Self-pay | Admitting: Internal Medicine

## 2014-06-09 DIAGNOSIS — Z1231 Encounter for screening mammogram for malignant neoplasm of breast: Secondary | ICD-10-CM

## 2014-06-20 ENCOUNTER — Ambulatory Visit (HOSPITAL_COMMUNITY)
Admission: RE | Admit: 2014-06-20 | Discharge: 2014-06-20 | Disposition: A | Discharge status: Home / Self Care | Payer: Medicare Other | Source: Ambulatory Visit | Attending: Internal Medicine | Admitting: Internal Medicine

## 2014-06-20 DIAGNOSIS — Z1231 Encounter for screening mammogram for malignant neoplasm of breast: Secondary | ICD-10-CM | POA: Insufficient documentation

## 2014-08-01 DIAGNOSIS — H02403 Unspecified ptosis of bilateral eyelids: Secondary | ICD-10-CM | POA: Diagnosis not present

## 2014-08-01 DIAGNOSIS — Z961 Presence of intraocular lens: Secondary | ICD-10-CM | POA: Diagnosis not present

## 2014-08-01 DIAGNOSIS — H524 Presbyopia: Secondary | ICD-10-CM | POA: Diagnosis not present

## 2014-08-23 ENCOUNTER — Other Ambulatory Visit: Payer: Self-pay | Admitting: Internal Medicine

## 2014-08-25 DIAGNOSIS — L821 Other seborrheic keratosis: Secondary | ICD-10-CM | POA: Diagnosis not present

## 2014-08-25 DIAGNOSIS — L7211 Pilar cyst: Secondary | ICD-10-CM | POA: Diagnosis not present

## 2014-08-25 DIAGNOSIS — D1801 Hemangioma of skin and subcutaneous tissue: Secondary | ICD-10-CM | POA: Diagnosis not present

## 2014-08-25 DIAGNOSIS — L570 Actinic keratosis: Secondary | ICD-10-CM | POA: Diagnosis not present

## 2014-08-29 ENCOUNTER — Encounter: Payer: Self-pay | Admitting: Internal Medicine

## 2014-08-29 ENCOUNTER — Telehealth: Payer: Self-pay | Admitting: Internal Medicine

## 2014-08-29 ENCOUNTER — Encounter (INDEPENDENT_AMBULATORY_CARE_PROVIDER_SITE_OTHER): Payer: Medicare Other

## 2014-08-29 ENCOUNTER — Ambulatory Visit (INDEPENDENT_AMBULATORY_CARE_PROVIDER_SITE_OTHER): Payer: Medicare Other | Admitting: Internal Medicine

## 2014-08-29 VITALS — BP 130/72 | HR 64 | Temp 97.9°F | Ht 63.5 in | Wt 149.1 lb

## 2014-08-29 DIAGNOSIS — E559 Vitamin D deficiency, unspecified: Secondary | ICD-10-CM

## 2014-08-29 DIAGNOSIS — M8580 Other specified disorders of bone density and structure, unspecified site: Secondary | ICD-10-CM

## 2014-08-29 DIAGNOSIS — I10 Essential (primary) hypertension: Secondary | ICD-10-CM

## 2014-08-29 DIAGNOSIS — E785 Hyperlipidemia, unspecified: Secondary | ICD-10-CM

## 2014-08-29 LAB — VITAMIN D 25 HYDROXY (VIT D DEFICIENCY, FRACTURES): VITD: 91.87 ng/mL (ref 30.00–100.00)

## 2014-08-29 LAB — BASIC METABOLIC PANEL
BUN: 15 mg/dL (ref 6–23)
CHLORIDE: 101 meq/L (ref 96–112)
CO2: 31 mEq/L (ref 19–32)
CREATININE: 0.76 mg/dL (ref 0.40–1.20)
Calcium: 10 mg/dL (ref 8.4–10.5)
GFR: 76.28 mL/min (ref >60.00)
Glucose, Bld: 86 mg/dL (ref 70–99)
Potassium: 4 mEq/L (ref 3.5–5.1)
Sodium: 138 mEq/L (ref 135–145)

## 2014-08-29 LAB — TSH: TSH: 1.68 u[IU]/mL (ref 0.35–4.50)

## 2014-08-29 NOTE — Progress Notes (Signed)
Pre visit review using our clinic review tool, if applicable. No additional management support is needed unless otherwise documented below in the visit note. 

## 2014-08-29 NOTE — Assessment & Plan Note (Signed)
Statin intolerance & age preclude statin

## 2014-08-29 NOTE — Assessment & Plan Note (Signed)
Blood pressure goals reviewed. BMET 

## 2014-08-29 NOTE — Assessment & Plan Note (Signed)
Vitamin D level 

## 2014-08-29 NOTE — Telephone Encounter (Signed)
Patient is a Dr. [name] patient.  She seen Dr. [name] today.  She does not have any other appointments scheduled with Dr. [name] for the rest of the year.  Dr. [name] suggested that she see if Dr. Ronnald Ramp will take patient on.  Please advise.

## 2014-08-29 NOTE — Assessment & Plan Note (Signed)
BMD declined

## 2014-08-29 NOTE — Progress Notes (Signed)
   Subjective:    Patient ID: Denise Wagner, female    DOB: [birth year], 81 y.o.   MRN: [id number]  HPI The patient is here to assess status of active health conditions.  PMH, FH, & Social History reviewed & updated.  She has been compliant with her antihypertensive medications without adverse effects.  She is on a heart healthy low-salt diet.  Exercise comprises walking inside only. She denies cardiopulmonary symptoms except for some edema.  She's not monitoring blood pressure at home  She was on a statin remotely but it was discontinued.  She continues her vitamin D supplementation.BMD not UTD; "it's probably no better"  Her brother had a heart attack at 80;no family history of stroke.  No colonoscopy to date; "I have had no problem". SOC reviewed. No GI symptoms present.  She has annual mammograms; sister had breast cancer.    Review of Systems    Chest pain, palpitations, tachycardia, exertional dyspnea, paroxysmal nocturnal dyspnea, or claudication are absent.  Unexplained weight loss, abdominal pain, significant dyspepsia, dysphagia, melena, rectal bleeding, or persistently small caliber stools are denied.      Objective:   Physical Exam Pertinent positive findings include: She appears much younger than her stated age. Cerumen is present in the canals, greater on the left than the right w/o impaction. She does have decreased auditory acuity bilaterally She has a grade 1.5 systolic murmur with neck radiation bilaterally DIP osteoarthritic changes are present. She has valgus changes of the knees with crepitus bilaterally The posterior tibial pulses are decreased. She has trace-1/2+ edema. She has a seborrheic keratotic lesion in the epigastrium.  General appearance :adequately nourished; in no distress. Eyes: No conjunctival inflammation or scleral icterus is present. Oral exam: Dental hygiene is good. Lips and gums are healthy appearing.There is no  oropharyngeal erythema or exudate noted.  Heart:  Normal rate and regular rhythm. S1 and S2 normal without gallop, click, rub or other extra sounds   Lungs:Chest clear to auscultation; no wheezes, rhonchi,rales ,or rubs present.No increased work of breathing.  Abdomen: bowel sounds normal, soft and non-tender without masses, organomegaly or hernias noted.  No guarding or rebound.  Vascular : all pulses equal ; no bruits present. Skin:Warm & dry.  Intact without suspicious lesions or rashes ; no jaundice or tenting Lymphatic: No lymphadenopathy is noted about the head, neck, axilla Neuro: Strength, tone & DTRs normal.        Assessment & Plan:  See Current Assessment & Plan in Problem List under specific Diagnosis Preventive care has been selective with main focus on breast cancer risk.  Age mitigates against pursuing statin or colonoscopy

## 2014-08-29 NOTE — Patient Instructions (Signed)
Your next office appointment will be determined based upon review of your pending labs . Those instructions will be transmitted to you by mail Critical values will be called  Minimal Blood Pressure Goal= AVERAGE < 140/90;  Ideal is an AVERAGE < 135/85. This AVERAGE should be calculated from @ least 5-7 BP readings taken @ different times of day on different days of week. You should not respond to isolated BP readings , but rather the AVERAGE for that week .Please bring your  blood pressure cuff to office visits to verify that it is reliable.It  can also be checked against the blood pressure device at the pharmacy. Finger or wrist cuffs are not dependable; an arm cuff is.

## 2014-08-30 ENCOUNTER — Telehealth: Payer: Self-pay | Admitting: Internal Medicine

## 2014-08-30 NOTE — Telephone Encounter (Signed)
emmi mailed  °

## 2014-08-31 NOTE — Telephone Encounter (Signed)
Ok with me 

## 2014-09-01 NOTE — Telephone Encounter (Signed)
Got patient scheduled

## 2014-09-04 ENCOUNTER — Telehealth: Payer: Self-pay | Admitting: Internal Medicine

## 2014-09-04 NOTE — Telephone Encounter (Signed)
Please schedule patient, thanks!

## 2014-09-04 NOTE — Telephone Encounter (Signed)
Call [facility] @ [phone] & come in  Please bring your  blood pressure cuff to office visits to verify that it is reliable.It  can also be checked against the blood pressure device at the pharmacy. Finger or wrist cuffs are not dependable; an arm cuff is. Please bring actual pill bottles

## 2014-09-04 NOTE — Telephone Encounter (Signed)
Patient Name: Denise Wagner  DOB: Nov 28, 1923    Initial Comment Caller states her BP has increased. Currently 189/84.    Nurse Assessment  Nurse: Verlin Fester RN, Stanton Kidney Date/Time Eilene Ghazi Time): 09/04/2014 2:28:14 PM  Confirm and document reason for call. If symptomatic, describe symptoms. ---Patient states her BP has increased. It was 189/84 earlier. It is 172/83 now. She is taking Metoprolol twice daily and the doctor told her to start checking her BP and she is worried that it is too high and feels like they upset her earlier when she had it taken and it is was 189/84.  Has the patient traveled out of the country within the last 30 days? ---No  Does the patient require triage? ---Yes  Related visit to physician within the last 2 weeks? ---No  Does the PT have any chronic conditions? (i.e. diabetes, asthma, etc.) ---Yes  List chronic conditions. ---"HTN,     Guidelines    Guideline Title Affirmed Question Affirmed Notes  High Blood Pressure BP ? 160/100    Final Disposition User   See PCP When Office is Open (within 3 days) Verlin Fester, Therapist, sports, [employer]

## 2014-09-05 ENCOUNTER — Telehealth: Payer: Self-pay | Admitting: *Deleted

## 2014-09-05 ENCOUNTER — Encounter: Payer: Self-pay | Admitting: Internal Medicine

## 2014-09-05 ENCOUNTER — Ambulatory Visit (INDEPENDENT_AMBULATORY_CARE_PROVIDER_SITE_OTHER): Payer: Medicare Other | Admitting: Internal Medicine

## 2014-09-05 VITALS — BP 178/88 | HR 67 | Temp 97.8°F | Ht 64.0 in | Wt 150.0 lb

## 2014-09-05 DIAGNOSIS — I10 Essential (primary) hypertension: Secondary | ICD-10-CM

## 2014-09-05 MED ORDER — LOSARTAN POTASSIUM 50 MG PO TABS: 50.0000 mg | ORAL_TABLET | Freq: Every day | ORAL | Status: DC

## 2014-09-05 NOTE — Telephone Encounter (Signed)
[place] [facility] - Client [facility] Patient Name: Denise Wagner Gender: Female DOB: May 13, 1924 Age: 81 Y 3 M 23 D Return Phone Number: [phone] (Primary) Address: City/State/Zip: [place] [address] Client [facility] - Client Client Site [place] - Day Physician [place] Type Call Call Type Triage / Clinical Relationship To Patient Self Appointment Disposition EMR Appointment Scheduled Return Phone Number [phone] (Primary) Chief Complaint Blood Pressure High Initial Comment Caller states her BP has increased. Currently 189/84. [place] Not Listed appt for tomorrow PreDisposition Did not know what to do Info pasted into Epic Yes Nurse Assessment Nurse: Verlin Fester, RN, Stanton Kidney Date/Time (Eastern Time): 09/04/2014 2:28:14 PM Confirm and document reason for call. If symptomatic, describe symptoms. ---Patient states her BP has increased. It was 189/84 earlier. It is 172/83 now. She is taking Metoprolol twice daily and the doctor told her to start checking her BP and she is worried that it is too high and feels like they upset her earlier when she had it taken and it is was 189/84. Has the patient traveled out of the country within the last 30 days? ---No Does the patient require triage? ---Yes Related visit to physician within the last 2 weeks? ---No Does the PT have any chronic conditions? (i.e. diabetes, asthma, etc.) ---Yes List chronic conditions. ---"HTN, Guidelines Guideline Title Affirmed Question Affirmed Notes Nurse Date/Time (Eastern Time) High Blood Pressure BP # 160/100 Verlin Fester, RNStanton Kidney 09/04/2014 2:31:09 PM Disp. Time Eilene Ghazi Time) Disposition Final User 09/04/2014 2:38:18 PM See PCP When Office is Open (within 3 days) Yes Noe, RN, Nemiah Commander Understands: Yes PLEASE NOTE: All timestamps contained within this report are represented as [place] Standard  Time. CONFIDENTIALTY NOTICE: This fax transmission is intended only for the addressee. It contains information that is legally privileged, confidential or otherwise protected from use or disclosure. If you are not the intended recipient, you are strictly prohibited from reviewing, disclosing, copying using or disseminating any of this information or taking any action in reliance on or regarding this information. If you have received this fax in error, please notify us immediately by telephone so that we can arrange for its return to [place]. Phone: [phone], Toll-Free: [phone], Fax: [phone] Page: 2 of 2 Call Id: [id number] Disagree/Comply: Comply Care Advice Given Per Guideline SEE PCP WITHIN 3 DAYS: You need to be examined within 2 or 3 days. Call your doctor during regular office hours and make an appointment. (Note: if office will be open tomorrow, tell caller to call then, not in 3 days). HIGH BLOOD PRESSURE: * Untreated high blood pressure may cause damage to your heart, brain, kidneys, and eyes. * Treatment of high blood pressure can reduce the risk of stroke, heart attack, and heart failure. * The goal of blood pressure treatment for most patients with hypertension is to keep the blood pressure under 140/90. LIFESTYLE MODIFICATIONS - The following things can help you reduce your blood pressure: * EAT HEALTHY: Eat a diet rich in fresh fruits and vegetables, dietary fiber, non-animal protein (e.g., soy), and low-fat dairy products. Avoid foods with a high content of saturated fat or cholesterol. * DECREASE SODIUM INTAKE: Aim to eat less than 1,500 mg of sodium each day. Unfortunately 75% of the salt in the average person's diet is in pre-processed foods. * LIMIT ALCOHOL: Limit alcohol to 0-2 standard drinks each day. Men should have less than 14 dinks per week. Women should  have less than 9 drinks per week. A drink is 1.5 oz hard liquor (one shot or jigger; 45 ml), 5 oz wine (small  glass; 150 ml), 12 oz beer (one can; 360 ml). * EXERCISE, BE MORE PHYSICALLY ACTIVE: Do 30-60 minutes of moderate intensity exercise four to seven times a week. Examples include aerobic activities like brisk walking, cycling, and swimming. * REDUCE WEIGHT AND WAIST LINE: It is important to maintain a normal body weight. The goal should be a BMI (body mass index) under 25 for men and women, a waist circumference under 40 inches (102 cm) in men, and a waist circumference under 35 inches (88 cm) in women. * REDUCE STRESS: Find activities that help reduce your stress. Examples might include meditation, yoga, or even a restful walk in a park. CALL BACK IF: * Weakness or numbness of the face, arm or leg on one side of the body occurs * Difficulty walking, difficulty talking, or severe headache occurs * Chest pain or difficulty breathing occurs * Your blood pressure is over 180/110 * You become worse. CARE ADVICE given per High Blood Pressure (Adult) guideline.

## 2014-09-05 NOTE — Progress Notes (Signed)
Losartan script has been faxed to [facility] on file

## 2014-09-05 NOTE — Patient Instructions (Signed)
Minimal Blood Pressure Goal= AVERAGE < 140/90;  Ideal is an AVERAGE < 135/85. This AVERAGE should be calculated from @ least 5-7 BP readings taken @ different times of day on different days of week. You should not respond to isolated BP readings , but rather the AVERAGE for that week .Please bring your  blood pressure cuff to office visits to verify that it is reliable.It  can also be checked against the blood pressure device at the pharmacy.

## 2014-09-05 NOTE — Progress Notes (Signed)
   Subjective:    Patient ID: Denise Wagner, female    DOB: 10-Mar-1924, 81 y.o.   MRN: [id number]  HPI Symptoms began as an occipital headache 09/03/14, this was described as up to level V as a dull ache. One Advil provided relief. She has had a headache intermittently since. A friend suggested checking her blood pressure which demonstrated a value in the 180s over 60s. At the pharmacy her blood pressure was 190/84. She also was found to have a tachycardia at that time with a pulse of 114.   She bought a cuff and blood pressures range from 172/79-179/83.  She's had some associated blurred vision which has resolved. She has no other cardiac or neuro symptoms.  She's had some maxillary sinus discomfort and possibly postnasal drainage. She denies signs of frank rhinosinusitis. She does take Zyrtec; she is unsure whether it contains a decongestant.   Review of Systems   Chest pain, palpitations, tachycardia, exertional dyspnea, paroxysmal nocturnal dyspnea, or claudication are absent.Some chronic mild edema.  Significant frontal headache, facial pain , nasal purulence,  sore throat , otic pain or otic discharge denied. No fever , chills or sweats.      Objective:   Physical Exam Appears healthy and well-nourished & in no acute distress  Complete dentures  No carotid bruits are present.No neck vein distention present at 10 - 15 degrees. Thyroid normal to palpation  Heart rhythm and rate are normal with no gallop or murmur.S4 with slight slurring  Chest is clear with no increased work of breathing  There is no evidence of aortic aneurysm or renal artery bruits  Abdomen soft with no organomegaly or masses. No HJR  No clubbing, cyanosis or edema present.  Pedal pulses are intact  But slightly decreased  No ischemic skin changes are present . Fingernails healthy   Alert and oriented. Strength, tone, DTRs reflexes normal         Assessment & Plan:  #1 HTN  Blood pressure  goals reviewed. Add Losartan 50

## 2014-09-05 NOTE — Progress Notes (Signed)
Pre visit review using our clinic review tool, if applicable. No additional management support is needed unless otherwise documented below in the visit note. 

## 2014-09-15 ENCOUNTER — Telehealth: Payer: Self-pay | Admitting: Internal Medicine

## 2014-09-15 NOTE — Telephone Encounter (Signed)
Increase Losartan 50 mg to 2 daily; come in next week with home cuff & BP readings

## 2014-09-15 NOTE — Telephone Encounter (Signed)
Phone all to patient. She has been monitoring her blood pressure since her office visit. On Monday 2/15 it was 145/70 and in the evening 158/73. Yesterday a.m. 172/72 pulse 58, at 4 p.m. 181/78 pulse 62. Last night 179/80 pulse 61. This morning her blood pressure is 158/71 pulse 59. She did have a headache yesterday and took Tylenol and this did help.

## 2014-09-15 NOTE — Telephone Encounter (Signed)
Phone call to patient and let her know to increase Losartan 50 mg to 2 daily and patient states she already has an appt next Tues

## 2014-09-15 NOTE — Telephone Encounter (Signed)
Pt request to speak to the assistant concern about BP. Please give her a call.

## 2014-09-19 ENCOUNTER — Ambulatory Visit (INDEPENDENT_AMBULATORY_CARE_PROVIDER_SITE_OTHER): Payer: Medicare Other | Admitting: Internal Medicine

## 2014-09-19 ENCOUNTER — Encounter: Payer: Self-pay | Admitting: Internal Medicine

## 2014-09-19 VITALS — BP 156/74 | HR 65 | Temp 97.7°F | Resp 14 | Ht 64.0 in | Wt 150.2 lb

## 2014-09-19 DIAGNOSIS — I10 Essential (primary) hypertension: Secondary | ICD-10-CM

## 2014-09-19 MED ORDER — METOPROLOL TARTRATE 25 MG PO TABS: ORAL_TABLET | ORAL | Status: DC

## 2014-09-19 MED ORDER — LOSARTAN POTASSIUM-HCTZ 100-25 MG PO TABS: 1.0000 | ORAL_TABLET | Freq: Every day | ORAL | Status: DC

## 2014-09-19 NOTE — Progress Notes (Signed)
   Subjective:    Patient ID: Denise Wagner, female    DOB: 1924-04-02, 81 y.o.   MRN: [id number]  HPI  She is here for BP control asssessment She has increased the losartan to 50 mg twice a day as of 2/20. Since that time her blood pressure has ranged from 128/61-198/88. She has no symptoms except for some occasional occipital discomfort in the evening.  Review of Systems   Epiistaxis, chest pain, palpitations, exertional dyspnea, claudication, paroxysmal nocturnal dyspnea, or edema absent.    Objective:   Physical Exam  Pertinent or positive findings include: She has a grade 1 systolic murmur at the base. There is a left carotid bruit versus radiation of murmur. Lipedema is noted of the ankles. Pedal pulses are equal but slightly decreased.  General appearance :adequately nourished; in no distress.Appears younger than stated age Eyes: No conjunctival inflammation or scleral icterus is present. No neck vein distention @ 10 degrees Heart:  Normal rate and regular rhythm. S1 and S2 normal without gallop, click, rub or other extra sounds   Lungs:Chest clear to auscultation; no wheezes, rhonchi,rales ,or rubs present.No increased work of breathing.  Abdomen: bowel sounds normal, soft and non-tender without masses, organomegaly or hernias noted.  No guarding or rebound. No HJR. No AAA Vascular : all pulses equal ; no bruits present. Skin:Warm & dry.  Intact without suspicious lesions or rashes ; no jaundice or tenting Lymphatic: No lymphadenopathy is noted about the head, neck, axilla Neuro: Strength, tone  normal.           Assessment & Plan:  See Current Assessment & Plan in Problem List under specific Diagnosis

## 2014-09-19 NOTE — Progress Notes (Signed)
Pre visit review using our clinic review tool, if applicable. No additional management support is needed unless otherwise documented below in the visit note. 

## 2014-09-19 NOTE — Patient Instructions (Signed)

## 2014-09-20 NOTE — Assessment & Plan Note (Signed)
New regimine: Losartan/HCT 100/25 qd and Metoprolol 25 mg bid BP goals reviewed Validation of cuff readings critical

## 2014-10-03 ENCOUNTER — Encounter: Payer: Self-pay | Admitting: Internal Medicine

## 2014-10-03 ENCOUNTER — Ambulatory Visit (INDEPENDENT_AMBULATORY_CARE_PROVIDER_SITE_OTHER): Payer: Medicare Other | Admitting: Internal Medicine

## 2014-10-03 VITALS — BP 140/66 | HR 81 | Temp 98.0°F | Ht 64.0 in | Wt 150.0 lb

## 2014-10-03 DIAGNOSIS — I10 Essential (primary) hypertension: Secondary | ICD-10-CM

## 2014-10-03 NOTE — Progress Notes (Signed)
Pre visit review using our clinic review tool, if applicable. No additional management support is needed unless otherwise documented below in the visit note. 

## 2014-10-03 NOTE — Progress Notes (Signed)
   Subjective:    Patient ID: Denise Wagner, female    DOB: 02/06/1924, 81 y.o.   MRN: [id number]  HPI Her blood pressure range 112-158/60-74. She has had isolated systolic as high as 557. Exercise has been  limited by the weather and housework.  She is on heart healthy, no added salt diet.  She has been taking the HCTZ in addition to losartan plus HCTZ  She has had occasional frontal and occipital area headache which she describes as "an aggravation". This has occurred  3-4 times & is completely relieved  by Advil 200 mg.  Se has no other cardiovascular symptoms.     Review of Systems  Significant headaches, epistaxis, chest pain, palpitations, exertional dyspnea, claudication, paroxysmal nocturnal dyspnea, or edema absent.     Objective:   Physical Exam  Positive or pertinent findings include: Appears younger than stated age She is a very faint left carotid bruit.  She has a grade 1/2 systolic murmur at the base.  The posterior tibial pulses are slightly decreased.  She does have some lipedema at the ankles.  Comparison was made with her cuff & the office cuff. A reading was 152/72 on office cuff & was 160s over 80s in both arms with hers. Pulse 64.  Appears healthy and well-nourished & in no acute distress No neck vein distention present at 10 - 15 degrees. Thyroid normal to palpation Heart rhythm and rate are normal with no gallop  Chest is clear with no increased work of breathing There is no evidence of aortic aneurysm or renal artery bruits Abdomen soft with no organomegaly or masses. No HJR No clubbing, cyanosis present. Pedal pulses are equal No ischemic skin changes are present . Fingernails healthy  Alert and oriented. Strength, tone, DTRs reflexes normal        Assessment & Plan:  #1 hypertension, adequately controlled  Plan: She will stop the extra HCTZ and monitor blood pressure.

## 2014-10-03 NOTE — Patient Instructions (Addendum)
Please keep a diary of your headaches . Document  each occurrence on the calendar with notation of : #1 any prodrome ( any non headache symptom such as marked fatigue,visual changes, ,etc ) which precedes actual headache ; #2) severity on 1-10 scale; #3) any triggers ( food/ drink,enviromenntal or weather changes ,physical or emotional stress) in 8-12 hour period prior to the headache; & #4) response to any medications or other intervention. Please review "Headache" @ [facility] for additional information.   Stop the extra HCTZ, "fluid pill". It has been combined with the Losartan.

## 2014-11-21 ENCOUNTER — Other Ambulatory Visit: Payer: Self-pay | Admitting: Internal Medicine

## 2014-11-21 DIAGNOSIS — I10 Essential (primary) hypertension: Secondary | ICD-10-CM

## 2014-11-21 MED ORDER — METOPROLOL TARTRATE 25 MG PO TABS
ORAL_TABLET | ORAL | Status: DC
Start: 2014-11-21 — End: 2015-08-20

## 2014-11-21 NOTE — Addendum Note (Signed)
Addended by: Earnstine Regal on: 11/21/2014 09:01 AM   Modules accepted: Orders

## 2014-12-27 DIAGNOSIS — M1711 Unilateral primary osteoarthritis, right knee: Secondary | ICD-10-CM | POA: Diagnosis not present

## 2015-01-03 DIAGNOSIS — M1711 Unilateral primary osteoarthritis, right knee: Secondary | ICD-10-CM | POA: Diagnosis not present

## 2015-01-15 DIAGNOSIS — M1711 Unilateral primary osteoarthritis, right knee: Secondary | ICD-10-CM | POA: Diagnosis not present

## 2015-03-07 ENCOUNTER — Other Ambulatory Visit: Payer: Self-pay | Admitting: Internal Medicine

## 2015-03-19 ENCOUNTER — Ambulatory Visit: Payer: Medicare Other | Admitting: Family

## 2015-03-20 ENCOUNTER — Ambulatory Visit (INDEPENDENT_AMBULATORY_CARE_PROVIDER_SITE_OTHER)
Admission: RE | Admit: 2015-03-20 | Discharge: 2015-03-20 | Disposition: A | Discharge status: Home / Self Care | Payer: Medicare Other | Source: Ambulatory Visit | Attending: Internal Medicine | Admitting: Internal Medicine

## 2015-03-20 ENCOUNTER — Encounter: Payer: Self-pay | Admitting: Internal Medicine

## 2015-03-20 ENCOUNTER — Ambulatory Visit (INDEPENDENT_AMBULATORY_CARE_PROVIDER_SITE_OTHER): Payer: Medicare Other | Admitting: Internal Medicine

## 2015-03-20 VITALS — BP 140/66 | HR 56 | Temp 97.9°F | Resp 15 | Wt 150.0 lb

## 2015-03-20 DIAGNOSIS — R059 Cough, unspecified: Secondary | ICD-10-CM

## 2015-03-20 DIAGNOSIS — J310 Chronic rhinitis: Secondary | ICD-10-CM | POA: Diagnosis not present

## 2015-03-20 DIAGNOSIS — R05 Cough: Secondary | ICD-10-CM

## 2015-03-20 MED ORDER — AZITHROMYCIN 250 MG PO TABS: ORAL_TABLET | ORAL | Status: DC

## 2015-03-20 NOTE — Progress Notes (Signed)
   Subjective:    Patient ID: Denise Wagner, female    DOB: Aug 21, 1923, 81 y.o.   MRN: [id number]  HPI She describes a cough for 2-3 weeks which is nonproductive.This has improved with Tessalon. She's had some discomfort in the lateral cheek area with some scant yellow nasal discharge. She also describes slightly sore throat.  This phenomena tends to be a recurring theme every year at this time.   Review of Systems She denies sinificant purulent nasal secretions, frontal sinus or facial sinus pain. She also denies fever, chills, or sweats. The cough is not associated shortness of breath or wheezing.  She's had some sneezing but no itchy, watery eyes.     Objective:   Physical Exam  Pertinent or positive findings include: Arcus senilis is present. There is marked erythema of the nasal mucosa, especially nasal septa. She has complete dentures. Breath sounds are somewhat decreased. She has a grade 1/2 systolic murmur at left sternal border.  General appearance:Adequately nourished; no acute distress or increased work of breathing is present.    Lymphatic: No  lymphadenopathy about the head, neck, or axilla .  Eyes: No conjunctival inflammation or lid edema is present. There is no scleral icterus.  Ears:  External ear exam shows no significant lesions or deformities.  Otoscopic examination reveals clear canals, tympanic membranes are intact bilaterally without bulging, retraction, inflammation or discharge.  Nose:  External nasal examination shows no deformity or inflammation.  Oral exam: There is no oropharyngeal erythema or exudate .  Neck:  No deformities, thyromegaly, masses, or tenderness noted.   Supple with full range of motion without pain.   Heart:  Normal rate and regular rhythm. S1 and S2 normal without gallop, click, rub or other extra sounds.   Lungs:Chest clear to auscultation; no wheezes, rhonchi,rales ,or rubs present.  Extremities:  No cyanosis, edema, or  clubbing  noted    Skin: Warm & dry w/o tenting or jaundice. No significant lesions or rash.        Assessment & present Plan:  #1 cough times 2-3 weeks  #2 nonallergic rhinitis.  See orders & recommendations

## 2015-03-20 NOTE — Progress Notes (Signed)
Pre visit review using our clinic review tool, if applicable. No additional management support is needed unless otherwise documented below in the visit note. 

## 2015-03-20 NOTE — Patient Instructions (Signed)
Plain Mucinex (NOT D) for thick secretions ;force NON dairy fluids .   Nasal cleansing in the shower as discussed with lather of mild shampoo.After 10 seconds wash off lather while  exhaling through nostrils. Make sure that all residual soap is removed to prevent irritation.  Flonase OR Nasacort AQ 1 spray in each nostril twice a day as needed. Use the "crossover" technique into opposite nostril spraying toward opposite ear @ 45 degree angle, not straight up into nostril.  Plain Allegra (NOT D )  160 daily , Loratidine 10 mg , OR Zyrtec 10 mg @ bedtime  as needed for itchy eyes & sneezing. Zicam Melts or Zinc lozenges as per package label for sore throat .  Complementary options to boost immunity include  vitamin C 2000 mg daily; & Echinacea for 4-7 days.  Fill the  prescription for antibiotic if fever; discolored nasal or chest secretions; or frontal headache or facial pain  present

## 2015-03-21 ENCOUNTER — Encounter: Payer: Self-pay | Admitting: Internal Medicine

## 2015-05-10 ENCOUNTER — Ambulatory Visit (INDEPENDENT_AMBULATORY_CARE_PROVIDER_SITE_OTHER): Payer: Medicare Other

## 2015-05-10 DIAGNOSIS — Z23 Encounter for immunization: Secondary | ICD-10-CM | POA: Diagnosis not present

## 2015-05-16 ENCOUNTER — Encounter: Payer: Self-pay | Admitting: Internal Medicine

## 2015-05-16 ENCOUNTER — Ambulatory Visit (INDEPENDENT_AMBULATORY_CARE_PROVIDER_SITE_OTHER): Payer: Medicare Other | Admitting: Internal Medicine

## 2015-05-16 VITALS — BP 138/58 | HR 60 | Temp 98.1°F | Resp 16 | Ht 63.25 in | Wt 152.0 lb

## 2015-05-16 DIAGNOSIS — M8580 Other specified disorders of bone density and structure, unspecified site: Secondary | ICD-10-CM | POA: Diagnosis not present

## 2015-05-16 DIAGNOSIS — I10 Essential (primary) hypertension: Secondary | ICD-10-CM | POA: Diagnosis not present

## 2015-05-16 NOTE — Progress Notes (Signed)
Pre visit review using our clinic review tool, if applicable. No additional management support is needed unless otherwise documented below in the visit note. 

## 2015-05-16 NOTE — Assessment & Plan Note (Signed)
Has not tolerated statins  Exercises Takes fish oil

## 2015-05-16 NOTE — Progress Notes (Signed)
Subjective:    Patient ID: Denise Wagner, female    DOB: 08-11-1923, 81 y.o.   MRN: [id number]  HPI She is here to establish with a new pcp and for follow up of her hypertension.  Osteopenia:  She has osteopenia. Last bone density was, but she is not interested in having because she does not want to take medication she does take calcium and vitamin D twice daily and wonders if she is talking too much calcium. She hesitates a separate vitamin D pill once a day. She does water exercises in the summer. She does housework, but no other exercise.   Hypertension: She is taking her medication daily. She is compliant with a low sodium diet.  She denies chest pain, palpitations, shortness of breath and regular headaches. She has edema in her ankles - it is gone in the morning and accumulates during the day.  She is exercising regularly, but only in the summer.  She does monitor her blood pressure at home.      Medications and allergies reviewed with patient and updated if appropriate.  Patient Active Problem List   Diagnosis Date Noted  . Vitamin D deficiency 10/25/2007  . Hyperlipidemia 10/25/2007  . Essential hypertension 10/25/2007  . Osteopenia 08/18/2007    Past Medical History  Diagnosis Date  . Hyperlipidemia   . Hypertension   . Osteopenia   . Vitamin D deficiency     Past Surgical History  Procedure Laterality Date  . Hemorrhoid surgery    . Gravida 0,para 0,mis 0, dr.gottsegen    . Synvisc injections; r knee      Dr Lynann Bologna  . Cataract surgery  2007    bilaterally  . No colonoscopy      "I never had a problem" ([place] reviewed)    Social History   Social History  . Marital Status: Widowed    Spouse Name: N/A  . Number of Children: N/A  . Years of Education: N/A   Occupational History  . retired    Social History Main Topics  . Smoking status: Never Smoker   . Smokeless tobacco: Not on file  . Alcohol Use: Yes     Comment: rarely  . Drug Use: No  .  Sexual Activity: Not on file   Other Topics Concern  . Not on file   Social History Narrative   No diet          Review of Systems  Constitutional: Negative for fever, chills, appetite change, fatigue and unexpected weight change.  Respiratory: Negative for cough, shortness of breath and wheezing.   Cardiovascular: Positive for leg swelling (chronic). Negative for chest pain and palpitations.  Gastrointestinal:       No GERD  Neurological: Positive for headaches (occasional). Negative for dizziness and light-headedness.  Psychiatric/Behavioral: Negative for dysphoric mood. The patient is not nervous/anxious.        Objective:   Filed Vitals:   05/16/15 1512  BP: 138/58  Pulse: 60  Temp: 98.1 F (36.7 C)  Resp: 16   Filed Weights   05/16/15 1512  Weight: 152 lb (68.947 kg)   Body mass index is 26.7 kg/(m^2).   Physical Exam  Constitutional: She is oriented to person, place, and time. She appears well-developed and well-nourished. No distress.  Neck: Neck supple. No JVD present. No tracheal deviation present. No thyromegaly present.  No carotid bruit  Cardiovascular: Normal rate, regular rhythm and normal heart sounds.   No murmur heard.  Pulmonary/Chest: Effort normal and breath sounds normal. No respiratory distress. She has no wheezes.  Musculoskeletal: She exhibits no edema.  Lymphadenopathy:    She has no cervical adenopathy.  Neurological: She is alert and oriented to person, place, and time.  Psychiatric: She has a normal mood and affect.        Assessment & Plan:   See Problem List.   Follow up in Feb or later for a physical - will do blood work prior

## 2015-05-16 NOTE — Assessment & Plan Note (Addendum)
bp well controlled She monitors her bp at home Exercising - water aerobics in summer -- try to increase exercise in fall/winter Continue current medications at current doses

## 2015-05-16 NOTE — Patient Instructions (Signed)
  We have reviewed your prior records including labs and tests today.   All other Health Maintenance issues reviewed.   All recommended immunizations and age-appropriate screenings are up-to-date.  No immunizations administered today.   Medications reviewed and updated.  You can decreased your calcium - vitamin d pill to once a day.     Please schedule followup in the Feb or March for your physical.

## 2015-05-16 NOTE — Assessment & Plan Note (Addendum)
Taking calium and vitamin D daily  - decrease to only one pill a day, continue second vitamin D pill Increase exercise - should exercise during the winter Deferred dexa - she is not interested in taking the medication

## 2015-05-28 ENCOUNTER — Telehealth: Payer: Self-pay | Admitting: *Deleted

## 2015-05-28 MED ORDER — LOSARTAN POTASSIUM-HCTZ 100-25 MG PO TABS: 1.0000 | ORAL_TABLET | Freq: Every day | ORAL | Status: DC

## 2015-05-28 NOTE — Telephone Encounter (Signed)
Receive call pt states she is [facility] Losartan/HCTZ. Inform pt will send...Johny Chess

## 2015-05-30 ENCOUNTER — Other Ambulatory Visit: Payer: Self-pay

## 2015-05-30 DIAGNOSIS — Z1231 Encounter for screening mammogram for malignant neoplasm of breast: Secondary | ICD-10-CM

## 2015-07-02 ENCOUNTER — Ambulatory Visit: Payer: Medicare Other

## 2015-07-05 ENCOUNTER — Ambulatory Visit
Admission: RE | Admit: 2015-07-05 | Discharge: 2015-07-05 | Disposition: A | Discharge status: Home / Self Care | Payer: Medicare Other | Source: Ambulatory Visit

## 2015-07-05 DIAGNOSIS — Z1231 Encounter for screening mammogram for malignant neoplasm of breast: Secondary | ICD-10-CM

## 2015-08-20 ENCOUNTER — Other Ambulatory Visit: Payer: Self-pay | Admitting: *Deleted

## 2015-08-20 DIAGNOSIS — I10 Essential (primary) hypertension: Secondary | ICD-10-CM

## 2015-08-20 MED ORDER — METOPROLOL TARTRATE 25 MG PO TABS: 25.0000 mg | ORAL_TABLET | Freq: Two times a day (BID) | ORAL | Status: DC

## 2015-08-20 NOTE — Telephone Encounter (Signed)
Left msg on triage needing refills. Called pt back verified which med she is needing inform will [facility],/lmb

## 2015-08-27 DIAGNOSIS — Z961 Presence of intraocular lens: Secondary | ICD-10-CM | POA: Diagnosis not present

## 2015-08-27 DIAGNOSIS — H52203 Unspecified astigmatism, bilateral: Secondary | ICD-10-CM | POA: Diagnosis not present

## 2015-08-27 DIAGNOSIS — H04123 Dry eye syndrome of bilateral lacrimal glands: Secondary | ICD-10-CM | POA: Diagnosis not present

## 2015-09-06 ENCOUNTER — Telehealth: Payer: Self-pay | Admitting: Internal Medicine

## 2015-09-06 DIAGNOSIS — I10 Essential (primary) hypertension: Secondary | ICD-10-CM

## 2015-09-06 DIAGNOSIS — Z0000 Encounter for general adult medical examination without abnormal findings: Secondary | ICD-10-CM

## 2015-09-06 DIAGNOSIS — E785 Hyperlipidemia, unspecified: Secondary | ICD-10-CM

## 2015-09-06 DIAGNOSIS — M8580 Other specified disorders of bone density and structure, unspecified site: Secondary | ICD-10-CM

## 2015-09-06 DIAGNOSIS — Z139 Encounter for screening, unspecified: Secondary | ICD-10-CM

## 2015-09-06 NOTE — Telephone Encounter (Signed)
Ordered - basic blood work

## 2015-09-06 NOTE — Telephone Encounter (Signed)
Spoke with pt to inform.  

## 2015-09-06 NOTE — Telephone Encounter (Signed)
Please advise 

## 2015-09-06 NOTE — Telephone Encounter (Signed)
I have scheduled patient's CPE.  States Dr. Quay Burow was wanting labs done before her CPE.  Please follow up with patient.

## 2015-10-04 ENCOUNTER — Other Ambulatory Visit (INDEPENDENT_AMBULATORY_CARE_PROVIDER_SITE_OTHER): Payer: Medicare Other

## 2015-10-04 DIAGNOSIS — M8580 Other specified disorders of bone density and structure, unspecified site: Secondary | ICD-10-CM

## 2015-10-04 DIAGNOSIS — Z0000 Encounter for general adult medical examination without abnormal findings: Secondary | ICD-10-CM | POA: Diagnosis not present

## 2015-10-04 DIAGNOSIS — Z139 Encounter for screening, unspecified: Secondary | ICD-10-CM

## 2015-10-04 DIAGNOSIS — I10 Essential (primary) hypertension: Secondary | ICD-10-CM | POA: Diagnosis not present

## 2015-10-04 DIAGNOSIS — E785 Hyperlipidemia, unspecified: Secondary | ICD-10-CM

## 2015-10-04 LAB — COMPREHENSIVE METABOLIC PANEL
ALT: 14 U/L (ref 0–35)
AST: 20 U/L (ref 0–37)
Albumin: 3.8 g/dL (ref 3.5–5.2)
Alkaline Phosphatase: 39 U/L (ref 39–117)
BUN: 16 mg/dL (ref 6–23)
CALCIUM: 9.7 mg/dL (ref 8.4–10.5)
CHLORIDE: 101 meq/L (ref 96–112)
CO2: 25 meq/L (ref 19–32)
Creatinine, Ser: 0.89 mg/dL (ref 0.40–1.20)
GFR: 63.42 mL/min (ref >60.00)
Glucose, Bld: 89 mg/dL (ref 70–99)
Potassium: 4.4 mEq/L (ref 3.5–5.1)
Sodium: 136 mEq/L (ref 135–145)
Total Bilirubin: 0.9 mg/dL (ref 0.2–1.2)
Total Protein: 6.3 g/dL (ref 6.0–8.3)

## 2015-10-04 LAB — CBC WITH DIFFERENTIAL/PLATELET
BASOS ABS: 0 10*3/uL (ref 0.0–0.1)
Basophils Relative: 0.5 % (ref 0.0–3.0)
EOS PCT: 3.8 % (ref 0.0–5.0)
Eosinophils Absolute: 0.3 10*3/uL (ref 0.0–0.7)
HEMATOCRIT: 38.3 % (ref 36.0–46.0)
Hemoglobin: 12.9 g/dL (ref 12.0–15.0)
Lymphocytes Relative: 33.1 % (ref 12.0–46.0)
Lymphs Abs: 2.2 10*3/uL (ref 0.7–4.0)
MCHC: 33.7 g/dL (ref 30.0–36.0)
MCV: 92.8 fl (ref 78.0–100.0)
MONO ABS: 0.6 10*3/uL (ref 0.1–1.0)
Monocytes Relative: 8.5 % (ref 3.0–12.0)
NEUTROS PCT: 54.1 % (ref 43.0–77.0)
Neutro Abs: 3.6 10*3/uL (ref 1.4–7.7)
PLATELETS: 218 10*3/uL (ref 150.0–400.0)
RBC: 4.13 Mil/uL (ref 3.87–5.11)
RDW: 13.4 % (ref 11.5–15.5)
WBC: 6.7 10*3/uL (ref 4.0–10.5)

## 2015-10-04 LAB — LIPID PANEL
Cholesterol: 221 mg/dL — ABNORMAL HIGH (ref 0–200)
HDL: 52.3 mg/dL (ref >39.00)
LDL CALC: 149 mg/dL — AB (ref 0–99)
NonHDL: 168.71
TRIGLYCERIDES: 98 mg/dL (ref 0.0–149.0)
Total CHOL/HDL Ratio: 4
VLDL: 19.6 mg/dL (ref 0.0–40.0)

## 2015-10-04 LAB — TSH: TSH: 1.64 u[IU]/mL (ref 0.35–4.50)

## 2015-10-05 ENCOUNTER — Encounter: Payer: Self-pay | Admitting: Emergency Medicine

## 2015-10-22 ENCOUNTER — Encounter: Payer: Self-pay | Admitting: Internal Medicine

## 2015-10-22 ENCOUNTER — Ambulatory Visit (INDEPENDENT_AMBULATORY_CARE_PROVIDER_SITE_OTHER): Payer: Medicare Other | Admitting: Internal Medicine

## 2015-10-22 VITALS — BP 164/74 | HR 61 | Temp 98.1°F | Resp 16 | Wt 152.0 lb

## 2015-10-22 DIAGNOSIS — I10 Essential (primary) hypertension: Secondary | ICD-10-CM | POA: Diagnosis not present

## 2015-10-22 DIAGNOSIS — Z0000 Encounter for general adult medical examination without abnormal findings: Secondary | ICD-10-CM | POA: Diagnosis not present

## 2015-10-22 DIAGNOSIS — E785 Hyperlipidemia, unspecified: Secondary | ICD-10-CM | POA: Diagnosis not present

## 2015-10-22 MED ORDER — FLUTICASONE PROPIONATE 50 MCG/ACT NA SUSP: 1.0000 | Freq: Two times a day (BID) | NASAL | Status: DC | PRN

## 2015-10-22 MED ORDER — ATORVASTATIN CALCIUM 10 MG PO TABS: 5.0000 mg | ORAL_TABLET | Freq: Every day | ORAL | Status: DC

## 2015-10-22 NOTE — Patient Instructions (Addendum)
Try the lipitor three times a week. Have blood work done to recheck your cholesterol and liver tests in about 2 months.    Denise Wagner , Thank you for taking time to come for your Medicare Wellness Visit. I appreciate your ongoing commitment to your health goals. Please review the following plan we discussed and let me know if I can assist you in the future.   These are the goals we discussed: Goals    None      This is a list of the screening recommended for you and due dates:  Health Maintenance  Topic Date Due  . DEXA scan (bone density measurement)  01/07/2017*  . Flu Shot  02/26/2016  . Mammogram  07/04/2017  . Tetanus Vaccine  07/14/2022  . Shingles Vaccine  Completed  . Pneumonia vaccines  Completed  *Topic was postponed. The date shown is not the original due date.     Health Maintenance, Female Adopting a healthy lifestyle and getting preventive care can go a long way to promote health and wellness. Talk with your health care provider about what schedule of regular examinations is right for you. This is a good chance for you to check in with your provider about disease prevention and staying healthy. In between checkups, there are plenty of things you can do on your own. Experts have done a lot of research about which lifestyle changes and preventive measures are most likely to keep you healthy. Ask your health care provider for more information. WEIGHT AND DIET  Eat a healthy diet  Be sure to include plenty of vegetables, fruits, low-fat dairy products, and lean protein.  Do not eat a lot of foods high in solid fats, added sugars, or salt.  Get regular exercise. This is one of the most important things you can do for your health.  Most adults should exercise for at least 150 minutes each week. The exercise should increase your heart rate and make you sweat (moderate-intensity exercise).  Most adults should also do strengthening exercises at least twice a week. This is  in addition to the moderate-intensity exercise.  Maintain a healthy weight  Body mass index (BMI) is a measurement that can be used to identify possible weight problems. It estimates body fat based on height and weight. Your health care provider can help determine your BMI and help you achieve or maintain a healthy weight.  For females 28 years of age and older:   A BMI below 18.5 is considered underweight.  A BMI of 18.5 to 24.9 is normal.  A BMI of 25 to 29.9 is considered overweight.  A BMI of 30 and above is considered obese.  Watch levels of cholesterol and blood lipids  You should start having your blood tested for lipids and cholesterol at 80 years of age, then have this test every 5 years.  You may need to have your cholesterol levels checked more often if:  Your lipid or cholesterol levels are high.  You are older than 80 years of age.  You are at high risk for heart disease.  CANCER SCREENING   Lung Cancer  Lung cancer screening is recommended for adults 79-58 years old who are at high risk for lung cancer because of a history of smoking.  A yearly low-dose CT scan of the lungs is recommended for people who:  Currently smoke.  Have quit within the past 15 years.  Have at least a 30-pack-year history of smoking. A pack year  is smoking an average of one pack of cigarettes a day for 1 year.  Yearly screening should continue until it has been 15 years since you quit.  Yearly screening should stop if you develop a health problem that would prevent you from having lung cancer treatment.  Breast Cancer  Practice breast self-awareness. This means understanding how your breasts normally appear and feel.  It also means doing regular breast self-exams. Let your health care provider know about any changes, no matter how small.  If you are in your 20s or 30s, you should have a clinical breast exam (CBE) by a health care provider every 1-3 years as part of a regular  health exam.  If you are 26 or older, have a CBE every year. Also consider having a breast X-ray (mammogram) every year.  If you have a family history of breast cancer, talk to your health care provider about genetic screening.  If you are at high risk for breast cancer, talk to your health care provider about having an MRI and a mammogram every year.  Breast cancer gene (BRCA) assessment is recommended for women who have family members with BRCA-related cancers. BRCA-related cancers include:  Breast.  Ovarian.  Tubal.  Peritoneal cancers.  Results of the assessment will determine the need for genetic counseling and BRCA1 and BRCA2 testing. Cervical Cancer Your health care provider may recommend that you be screened regularly for cancer of the pelvic organs (ovaries, uterus, and vagina). This screening involves a pelvic examination, including checking for microscopic changes to the surface of your cervix (Pap test). You may be encouraged to have this screening done every 3 years, beginning at age 78.  For women ages 50-65, health care providers may recommend pelvic exams and Pap testing every 3 years, or they may recommend the Pap and pelvic exam, combined with testing for human papilloma virus (HPV), every 5 years. Some types of HPV increase your risk of cervical cancer. Testing for HPV may also be done on women of any age with unclear Pap test results.  Other health care providers may not recommend any screening for nonpregnant women who are considered low risk for pelvic cancer and who do not have symptoms. Ask your health care provider if a screening pelvic exam is right for you.  If you have had past treatment for cervical cancer or a condition that could lead to cancer, you need Pap tests and screening for cancer for at least 20 years after your treatment. If Pap tests have been discontinued, your risk factors (such as having a new sexual partner) need to be reassessed to determine if  screening should resume. Some women have medical problems that increase the chance of getting cervical cancer. In these cases, your health care provider may recommend more frequent screening and Pap tests. Colorectal Cancer  This type of cancer can be detected and often prevented.  Routine colorectal cancer screening usually begins at 80 years of age and continues through 80 years of age.  Your health care provider may recommend screening at an earlier age if you have risk factors for colon cancer.  Your health care provider may also recommend using home test kits to check for hidden blood in the stool.  A small camera at the end of a tube can be used to examine your colon directly (sigmoidoscopy or colonoscopy). This is done to check for the earliest forms of colorectal cancer.  Routine screening usually begins at age 35.  Direct examination of  the colon should be repeated every 5-10 years through 80 years of age. However, you may need to be screened more often if early forms of precancerous polyps or small growths are found. Skin Cancer  Check your skin from head to toe regularly.  Tell your health care provider about any new moles or changes in moles, especially if there is a change in a mole's shape or color.  Also tell your health care provider if you have a mole that is larger than the size of a pencil eraser.  Always use sunscreen. Apply sunscreen liberally and repeatedly throughout the day.  Protect yourself by wearing long sleeves, pants, a wide-brimmed hat, and sunglasses whenever you are outside. HEART DISEASE, DIABETES, AND HIGH BLOOD PRESSURE   High blood pressure causes heart disease and increases the risk of stroke. High blood pressure is more likely to develop in:  People who have blood pressure in the high end of the normal range (130-139/85-89 mm Hg).  People who are overweight or obese.  People who are African American.  If you are 64-83 years of age, have your  blood pressure checked every 3-5 years. If you are 29 years of age or older, have your blood pressure checked every year. You should have your blood pressure measured twice--once when you are at a hospital or clinic, and once when you are not at a hospital or clinic. Record the average of the two measurements. To check your blood pressure when you are not at a hospital or clinic, you can use:  An automated blood pressure machine at a pharmacy.  A home blood pressure monitor.  If you are between 1 years and 41 years old, ask your health care provider if you should take aspirin to prevent strokes.  Have regular diabetes screenings. This involves taking a blood sample to check your fasting blood sugar level.  If you are at a normal weight and have a low risk for diabetes, have this test once every three years after 80 years of age.  If you are overweight and have a high risk for diabetes, consider being tested at a younger age or more often. PREVENTING INFECTION  Hepatitis B  If you have a higher risk for hepatitis B, you should be screened for this virus. You are considered at high risk for hepatitis B if:  You were born in a country where hepatitis B is common. Ask your health care provider which countries are considered high risk.  Your parents were born in a high-risk country, and you have not been immunized against hepatitis B (hepatitis B vaccine).  You have HIV or AIDS.  You use needles to inject street drugs.  You live with someone who has hepatitis B.  You have had sex with someone who has hepatitis B.  You get hemodialysis treatment.  You take certain medicines for conditions, including cancer, organ transplantation, and autoimmune conditions. Hepatitis C  Blood testing is recommended for:  Everyone born from [birth year] through 1963.  Anyone with known risk factors for hepatitis C. Sexually transmitted infections (STIs)  You should be screened for sexually transmitted  infections (STIs) including gonorrhea and chlamydia if:  You are sexually active and are younger than 80 years of age.  You are older than 80 years of age and your health care provider tells you that you are at risk for this type of infection.  Your sexual activity has changed since you were last screened and you are at an increased  risk for chlamydia or gonorrhea. Ask your health care provider if you are at risk.  If you do not have HIV, but are at risk, it may be recommended that you take a prescription medicine daily to prevent HIV infection. This is called pre-exposure prophylaxis (PrEP). You are considered at risk if:  You are sexually active and do not regularly use condoms or know the HIV status of your partner(s).  You take drugs by injection.  You are sexually active with a partner who has HIV. Talk with your health care provider about whether you are at high risk of being infected with HIV. If you choose to begin PrEP, you should first be tested for HIV. You should then be tested every 3 months for as long as you are taking PrEP.  PREGNANCY   If you are premenopausal and you may become pregnant, ask your health care provider about preconception counseling.  If you may become pregnant, take 400 to 800 micrograms (mcg) of folic acid every day.  If you want to prevent pregnancy, talk to your health care provider about birth control (contraception). OSTEOPOROSIS AND MENOPAUSE   Osteoporosis is a disease in which the bones lose minerals and strength with aging. This can result in serious bone fractures. Your risk for osteoporosis can be identified using a bone density scan.  If you are 42 years of age or older, or if you are at risk for osteoporosis and fractures, ask your health care provider if you should be screened.  Ask your health care provider whether you should take a calcium or vitamin D supplement to lower your risk for osteoporosis.  Menopause may have certain physical  symptoms and risks.  Hormone replacement therapy may reduce some of these symptoms and risks. Talk to your health care provider about whether hormone replacement therapy is right for you.  HOME CARE INSTRUCTIONS   Schedule regular health, dental, and eye exams.  Stay current with your immunizations.   Do not use any tobacco products including cigarettes, chewing tobacco, or electronic cigarettes.  If you are pregnant, do not drink alcohol.  If you are breastfeeding, limit how much and how often you drink alcohol.  Limit alcohol intake to no more than 1 drink per day for nonpregnant women. One drink equals 12 ounces of beer, 5 ounces of wine, or 1 ounces of hard liquor.  Do not use street drugs.  Do not share needles.  Ask your health care provider for help if you need support or information about quitting drugs.  Tell your health care provider if you often feel depressed.  Tell your health care provider if you have ever been abused or do not feel safe at home.   This information is not intended to replace advice given to you by your health care provider. Make sure you discuss any questions you have with your health care provider.   Document Released: 01/27/2011 Document Revised: 08/04/2014 Document Reviewed: 06/15/2013 Elsevier Interactive Patient Education [employer].

## 2015-10-22 NOTE — Assessment & Plan Note (Addendum)
Controlled at home She will continue to monitor at home and call or return if her BP is elevated Continue low sodium diet, continue to be active

## 2015-10-22 NOTE — Progress Notes (Signed)
Subjective:    Patient ID: Denise Wagner, female    DOB: 1923-08-26, 82 y.o.   MRN: [id number]  HPI She is here to establish with a new pcp.  She is here for wellness/cpe.   She has had cold symptoms for about one week.  She has rhinorrhea, cough and she was able to bring some phlegm up and mild headache.  She denies nasal congestion, sob, wheeze, fever, and lightheadedness.   She has tried the netti pot and has been using rock and rye with honey for the cough.  Her cough sounds wet.  She thinks her symptoms are getting better.    Here for medicare wellness exam.   I have personally reviewed and have noted 1.The patient's medical and social history 2.Their use of alcohol, tobacco or illicit drugs 3.Their current medications and supplements 4.The patient's functional ability including ADL's, fall risks, home safety risks and                 hearing or visual impairment. 5.Diet and physical activities 6.Evidence for depression or mood disorders    Are there smokers in your home (other than you)? No  Risk Factors Exercise: more active in the summer, no regular exercise in winter, fairly active Dietary issues discussed: overall well balanced and healthy, controls portions, low sodium  Cardiac risk factors: advanced age (older than 45 for men, 33 for women), hypertension, hyperlipidemia  Depression Screen  Have you felt down, depressed or hopeless? No  Have you felt little interest or pleasure in doing things?  No Activities of Daily Living In your present state of health, do you have any difficulty performing the following activities?:  Driving? No Managing money?  No Feeding yourself? No Getting from bed to chair? No Climbing a flight of stairs? No Preparing food and eating?: No Bathing or showering? No Getting dressed: No Getting to/using the toilet? No Moving around from place to place: No In the past year have  you fallen or had a near fall?: No   Are you sexually active?  No  Hearing Difficulties: yes, does not bother her enough to have it evaluated Do you often ask people to speak up or repeat themselves? sometimes Do you experience ringing or noises in your ears? No Do you have difficulty understanding soft or whispered voices? yes Vision:              Any change in vision:  No              Up to date with eye exam: up to date  Memory:  Do you feel that you have a problem with memory? No  Do you often misplace items? No  Do you feel safe at home?  Yes  Cognitive Testing  Alert, Orientated? Yes  Normal Appearance? Yes  Recall of three objects?  Yes  Can perform simple calculations? Yes  Displays appropriate judgment? Yes  Can read the correct time from a watch face? Yes   Advanced Directives have been discussed with the patient? Yes, in place  Medications and allergies reviewed with patient and updated if appropriate.  Patient Active Problem List   Diagnosis Date Noted  . Vitamin D deficiency 10/25/2007  . Hyperlipidemia 10/25/2007  . Essential hypertension 10/25/2007  . Osteopenia 08/18/2007    Current Outpatient Prescriptions on File Prior to Visit  Medication Sig Dispense Refill  . aspirin 81 MG tablet Take 81 mg by mouth daily.      Marland Kitchen  cetirizine (ZYRTEC ALLERGY) 10 MG tablet Take 10 mg by mouth as needed.      . Cholecalciferol (VITAMIN D3) 1000 UNITS CAPS Take 1,000 mg by mouth daily.      Marland Kitchen L-Lysine 500 MG TABS Take 500 mg by mouth daily.      Marland Kitchen losartan-hydrochlorothiazide (HYZAAR) 100-25 MG tablet Take 1 tablet by mouth daily. 90 tablet 3  . metoprolol tartrate (LOPRESSOR) 25 MG tablet Take 1 tablet (25 mg total) by mouth 2 (two) times daily. 180 tablet 3  . Multiple Vitamins-Calcium (VIACTIV MULTI-VITAMIN PO) Take by mouth daily.      . Omega-3 Fatty Acids (FISH OIL) 1000 MG CAPS Take 1,000 mg by mouth. Take two daily     . vitamin C (ASCORBIC ACID) 500 MG tablet Take  500 mg by mouth daily. LIQUID GELS...1 by mouth once daily     . vitamin E (VITAMIN E) 400 UNIT capsule Take 400 Units by mouth daily.       No current facility-administered medications on file prior to visit.    Past Medical History  Diagnosis Date  . Hyperlipidemia   . Hypertension   . Osteopenia   . Vitamin D deficiency     Past Surgical History  Procedure Laterality Date  . Hemorrhoid surgery    . Gravida 0,para 0,mis 0, dr.gottsegen    . Synvisc injections; r knee      Dr Lynann Bologna  . Cataract surgery  2007    bilaterally  . No colonoscopy      "I never had a problem" ([place] reviewed)    Social History   Social History  . Marital Status: Widowed    Spouse Name: N/A  . Number of Children: N/A  . Years of Education: N/A   Occupational History  . retired    Social History Main Topics  . Smoking status: Never Smoker   . Smokeless tobacco: Not on file  . Alcohol Use: Yes     Comment: rarely  . Drug Use: No  . Sexual Activity: Not on file   Other Topics Concern  . Not on file   Social History Narrative   No diet          Family History  Problem Relation Age of Onset  . Arthritis Mother   . Heart disease Brother     MI in 59's  . Diabetes Maternal Grandmother   . Heart disease Cousin     MI  . Stroke Neg Hx   . Breast cancer Sister     Review of Systems  Constitutional: Negative for fever, chills and appetite change.  HENT: Positive for hearing loss and rhinorrhea. Negative for congestion, ear pain and sore throat.   Eyes: Negative for visual disturbance.  Respiratory: Positive for cough. Negative for shortness of breath and wheezing.   Cardiovascular: Positive for leg swelling. Negative for chest pain and palpitations.  Gastrointestinal: Negative for nausea, abdominal pain, diarrhea, constipation and blood in stool.       No gerd  Genitourinary: Negative for dysuria and hematuria.  Musculoskeletal: Positive for arthralgias (b/l knees -  intermittent). Negative for back pain.  Skin: Negative for color change and rash.  Neurological: Positive for light-headedness (mild with cold) and headaches (with recent cold). Negative for dizziness.  Psychiatric/Behavioral: Negative for sleep disturbance and dysphoric mood. The patient is not nervous/anxious.        Objective:   Filed Vitals:   10/22/15 1329  BP: 164/74  Pulse:  61  Temp: 98.1 F (36.7 C)  Resp: 16   Filed Weights   10/22/15 1329  Weight: 152 lb (68.947 kg)   Body mass index is 26.7 kg/(m^2).   Physical Exam Constitutional: She appears well-developed and well-nourished. No distress.  HENT:  Head: Normocephalic and atraumatic.  Right Ear: External ear normal. Normal ear canal and TM Left Ear: External ear normal.  Normal ear canal and TM Mouth/Throat: Oropharynx is clear and moist.  Normal bilateral ear canals and tympanic membranes  Eyes: Conjunctivae and EOM are normal.  Neck: Neck supple. No tracheal deviation present. No thyromegaly present.  No carotid bruit  Cardiovascular: Normal rate, regular rhythm and normal heart sounds.   2/6 systolic murmur.  trace edema. Pulmonary/Chest: Effort normal and breath sounds normal. No respiratory distress. She has no wheezes. She has no rales.  Abdominal: Soft. She exhibits no distension. There is no tenderness.  Lymphadenopathy: She has no cervical adenopathy.  Skin: Skin is warm and dry. She is not diaphoretic.  Psychiatric: She has a normal mood and affect. Her behavior is normal.       Assessment & Plan:   Wellness Exam Immunizations up to date  Colonoscopy - no needed at this age 11  Up to date  dexa - she deferred Eye exam up to date Hearing loss - yes, no concerns Memory concerns/difficulties - none, her memory is good  Independent of ADLs  -  Completely independent and denies any difficulty doing any of her ADL's   Physical exam: Screening blood work reviewed Immunizations  Up to  date  Colonoscopy not needed at this age, asymptomatic  Mammogram  Up to date  Dexa - she deferred Eye exams EKG last done 2013 Exercise - regular in the summer, active, but no regular exercise in the winter; stressed regular exercise Weight - just above normal, weight is good for her Skin   - no concerns Substance abuse - no abuse   See Problem List for Assessment and Plan of chronic medical problems.  Follow up annually

## 2015-10-22 NOTE — Assessment & Plan Note (Addendum)
Did not tolerate pravastatin - muscle aches  LDL higher than ideal Discussed that she should ideally be on a medication Willing to try lipitor - will try 5 mg 2-3 times a week Recheck cmp, lipid panel in 2 months Call with side effects - we can titrate medication dose

## 2015-10-22 NOTE — Progress Notes (Signed)
Pre visit review using our clinic review tool, if applicable. No additional management support is needed unless otherwise documented below in the visit note. 

## 2015-12-03 DIAGNOSIS — M1711 Unilateral primary osteoarthritis, right knee: Secondary | ICD-10-CM | POA: Diagnosis not present

## 2015-12-10 DIAGNOSIS — M1711 Unilateral primary osteoarthritis, right knee: Secondary | ICD-10-CM | POA: Diagnosis not present

## 2015-12-17 DIAGNOSIS — M1711 Unilateral primary osteoarthritis, right knee: Secondary | ICD-10-CM | POA: Diagnosis not present

## 2016-02-11 DIAGNOSIS — B0230 Zoster ocular disease, unspecified: Secondary | ICD-10-CM | POA: Diagnosis not present

## 2016-02-13 DIAGNOSIS — B0230 Zoster ocular disease, unspecified: Secondary | ICD-10-CM | POA: Diagnosis not present

## 2016-02-15 DIAGNOSIS — B0230 Zoster ocular disease, unspecified: Secondary | ICD-10-CM | POA: Diagnosis not present

## 2016-02-22 DIAGNOSIS — B0230 Zoster ocular disease, unspecified: Secondary | ICD-10-CM | POA: Diagnosis not present

## 2016-03-25 ENCOUNTER — Other Ambulatory Visit: Payer: Self-pay

## 2016-04-01 ENCOUNTER — Other Ambulatory Visit: Payer: Self-pay | Admitting: Internal Medicine

## 2016-04-11 ENCOUNTER — Other Ambulatory Visit: Payer: Self-pay | Admitting: Internal Medicine

## 2016-04-11 DIAGNOSIS — Z1231 Encounter for screening mammogram for malignant neoplasm of breast: Secondary | ICD-10-CM

## 2016-04-29 ENCOUNTER — Other Ambulatory Visit: Payer: Self-pay | Admitting: Internal Medicine

## 2016-04-30 ENCOUNTER — Ambulatory Visit (INDEPENDENT_AMBULATORY_CARE_PROVIDER_SITE_OTHER): Payer: Medicare Other

## 2016-04-30 DIAGNOSIS — Z23 Encounter for immunization: Secondary | ICD-10-CM

## 2016-05-28 HISTORY — PX: TRANSTHORACIC ECHOCARDIOGRAM: SHX275

## 2016-06-16 DIAGNOSIS — R0789 Other chest pain: Secondary | ICD-10-CM | POA: Diagnosis not present

## 2016-06-17 ENCOUNTER — Observation Stay (HOSPITAL_COMMUNITY)
Admission: EM | Admit: 2016-06-17 | Discharge: 2016-06-17 | Disposition: A | Discharge status: Home / Self Care | Payer: Medicare Other | Attending: Internal Medicine | Admitting: Internal Medicine

## 2016-06-17 ENCOUNTER — Observation Stay (HOSPITAL_BASED_OUTPATIENT_CLINIC_OR_DEPARTMENT_OTHER): Payer: Medicare Other

## 2016-06-17 ENCOUNTER — Emergency Department (HOSPITAL_COMMUNITY): Payer: Medicare Other

## 2016-06-17 ENCOUNTER — Encounter (HOSPITAL_COMMUNITY): Payer: Self-pay | Admitting: Emergency Medicine

## 2016-06-17 DIAGNOSIS — E782 Mixed hyperlipidemia: Secondary | ICD-10-CM | POA: Diagnosis not present

## 2016-06-17 DIAGNOSIS — R079 Chest pain, unspecified: Secondary | ICD-10-CM | POA: Diagnosis not present

## 2016-06-17 DIAGNOSIS — Z8249 Family history of ischemic heart disease and other diseases of the circulatory system: Secondary | ICD-10-CM | POA: Diagnosis not present

## 2016-06-17 DIAGNOSIS — E559 Vitamin D deficiency, unspecified: Secondary | ICD-10-CM | POA: Diagnosis not present

## 2016-06-17 DIAGNOSIS — Z79899 Other long term (current) drug therapy: Secondary | ICD-10-CM | POA: Diagnosis not present

## 2016-06-17 DIAGNOSIS — I208 Other forms of angina pectoris: Secondary | ICD-10-CM

## 2016-06-17 DIAGNOSIS — I10 Essential (primary) hypertension: Secondary | ICD-10-CM | POA: Insufficient documentation

## 2016-06-17 DIAGNOSIS — M8580 Other specified disorders of bone density and structure, unspecified site: Secondary | ICD-10-CM | POA: Diagnosis not present

## 2016-06-17 DIAGNOSIS — Z7982 Long term (current) use of aspirin: Secondary | ICD-10-CM | POA: Insufficient documentation

## 2016-06-17 DIAGNOSIS — R0789 Other chest pain: Secondary | ICD-10-CM | POA: Diagnosis not present

## 2016-06-17 DIAGNOSIS — E785 Hyperlipidemia, unspecified: Secondary | ICD-10-CM | POA: Diagnosis not present

## 2016-06-17 DIAGNOSIS — R072 Precordial pain: Principal | ICD-10-CM | POA: Insufficient documentation

## 2016-06-17 LAB — CBC
HCT: 35 % — ABNORMAL LOW (ref 36.0–46.0)
HEMATOCRIT: 33.8 % — AB (ref 36.0–46.0)
HEMOGLOBIN: 11.5 g/dL — AB (ref 12.0–15.0)
Hemoglobin: 12.2 g/dL (ref 12.0–15.0)
MCH: 31.9 pg (ref 26.0–34.0)
MCH: 30.9 pg (ref 26.0–34.0)
MCHC: 34.9 g/dL (ref 30.0–36.0)
MCHC: 34 g/dL (ref 30.0–36.0)
MCV: 91.4 fL (ref 78.0–100.0)
MCV: 90.9 fL (ref 78.0–100.0)
Platelets: 213 10*3/uL (ref 150–400)
Platelets: 201 10*3/uL (ref 150–400)
RBC: 3.83 MIL/uL — AB (ref 3.87–5.11)
RBC: 3.72 MIL/uL — ABNORMAL LOW (ref 3.87–5.11)
RDW: 12.8 % (ref 11.5–15.5)
RDW: 12.8 % (ref 11.5–15.5)
WBC: 8.6 10*3/uL (ref 4.0–10.5)
WBC: 7.5 10*3/uL (ref 4.0–10.5)

## 2016-06-17 LAB — BASIC METABOLIC PANEL
Anion gap: 7 (ref 5–15)
BUN: 15 mg/dL (ref 6–20)
CALCIUM: 9.7 mg/dL (ref 8.9–10.3)
CHLORIDE: 98 mmol/L — AB (ref 101–111)
CO2: 26 mmol/L (ref 22–32)
CREATININE: 0.87 mg/dL (ref 0.44–1.00)
GFR calc non Af Amer: 57 mL/min — ABNORMAL LOW (ref >60)
GLUCOSE: 108 mg/dL — AB (ref 65–99)
Potassium: 3.7 mmol/L (ref 3.5–5.1)
Sodium: 131 mmol/L — ABNORMAL LOW (ref 135–145)

## 2016-06-17 LAB — CREATININE, SERUM: CREATININE: 0.8 mg/dL (ref 0.44–1.00)

## 2016-06-17 LAB — TROPONIN I: Troponin I: <0.03 ng/mL (ref <0.03)

## 2016-06-17 LAB — I-STAT TROPONIN, ED: Troponin i, poc: 0 ng/mL (ref 0.00–0.08)

## 2016-06-17 LAB — ECHOCARDIOGRAM COMPLETE
Height: 65 in
Weight: 2438.4 oz

## 2016-06-17 MED ORDER — LOSARTAN POTASSIUM 50 MG PO TABS
100.0000 mg | ORAL_TABLET | Freq: Every day | ORAL | Status: DC
  Administered 2016-06-17: 100 mg via ORAL
  Filled 2016-06-17: qty 2

## 2016-06-17 MED ORDER — METOPROLOL TARTRATE 25 MG PO TABS
25.0000 mg | ORAL_TABLET | Freq: Two times a day (BID) | ORAL | Status: DC
  Administered 2016-06-17: 25 mg via ORAL
  Filled 2016-06-17: qty 1

## 2016-06-17 MED ORDER — LOSARTAN POTASSIUM-HCTZ 100-25 MG PO TABS: 1.0000 | ORAL_TABLET | Freq: Every day | ORAL | Status: DC

## 2016-06-17 MED ORDER — ASPIRIN EC 325 MG PO TBEC
325.0000 mg | DELAYED_RELEASE_TABLET | Freq: Every day | ORAL | Status: DC
  Administered 2016-06-17: 325 mg via ORAL
  Filled 2016-06-17: qty 1

## 2016-06-17 MED ORDER — FLUTICASONE PROPIONATE 50 MCG/ACT NA SUSP
2.0000 | Freq: Every day | NASAL | Status: DC
  Filled 2016-06-17: qty 16

## 2016-06-17 MED ORDER — ACETAMINOPHEN 325 MG PO TABS: 650.0000 mg | ORAL_TABLET | ORAL | Status: DC | PRN

## 2016-06-17 MED ORDER — ENOXAPARIN SODIUM 40 MG/0.4ML ~~LOC~~ SOLN
40.0000 mg | SUBCUTANEOUS | Status: DC
  Administered 2016-06-17: 40 mg via SUBCUTANEOUS
  Filled 2016-06-17: qty 0.4

## 2016-06-17 MED ORDER — OMEGA-3-ACID ETHYL ESTERS 1 G PO CAPS
1000.0000 mg | ORAL_CAPSULE | Freq: Every day | ORAL | Status: DC
  Administered 2016-06-17: 1000 mg via ORAL
  Filled 2016-06-17: qty 1

## 2016-06-17 MED ORDER — HYDROCHLOROTHIAZIDE 25 MG PO TABS
25.0000 mg | ORAL_TABLET | Freq: Every day | ORAL | Status: DC
  Administered 2016-06-17: 25 mg via ORAL
  Filled 2016-06-17: qty 1

## 2016-06-17 MED ORDER — ONDANSETRON HCL 4 MG/2ML IJ SOLN: 4.0000 mg | Freq: Four times a day (QID) | INTRAMUSCULAR | Status: DC | PRN

## 2016-06-17 MED ORDER — LORATADINE 10 MG PO TABS
10.0000 mg | ORAL_TABLET | Freq: Every day | ORAL | Status: DC
  Administered 2016-06-17: 10 mg via ORAL
  Filled 2016-06-17: qty 1

## 2016-06-17 NOTE — Discharge Summary (Signed)
Physician Discharge Summary  Denise Wagner [id number] DOB: 1924-04-03 DOA: 06/17/2016  PCP: Binnie Rail, MD  Admit date: 06/17/2016 Discharge date: 06/17/2016  Admitted From: Home Disposition:  Home  Recommendations for Outpatient Follow-up:  1. Follow up with PCP in 1-2 weeks 2. Follow up with Cardiology, they will call you for appointment in a couple of weeks.   Home Health: No  Equipment/Devices: None   Discharge Condition: Stable CODE STATUS: Full  Diet recommendation: Heart healthy  Brief/Interim Summary: From H&P: Denise Wagner is a 82 y.o. female with hypertension and hyperlipidemia started experiencing chest pain last evening while watching TV. Pain was retrosternal radiating to her neck. Patient states the pain was an uneasy feeling with no associated shortness of breath diaphoresis nausea or vomiting. Patient took aspirin and called her nephew. Since the pain was not getting better they later called the EMS. By the time patient reached ER chest pain improved. Patient was admitted for further chest pain workup.   Interim: Patient has been evaluated by cardiology and underwent echocardiogram. Echocardiogram showed mild LVH, EF of 65-70%, normal wall motion without regional wall motion abnormality. And grade 1 diastolic dysfunction. Troponin was negative. Patient's chest pain had resolved without recurrence.   Subjective on day of discharge: Doing well. No complaints. No chest pain, shortness of breath, nausea or vomiting or diarrhea, no diaphoresis.  Discharge Diagnoses:  Principal Problem:   Chest pain Active Problems:   Hyperlipidemia   Essential hypertension   Discharge Instructions  Discharge Instructions    Diet - low sodium heart healthy    Complete by:  As directed    Increase activity slowly    Complete by:  As directed        Medication List    TAKE these medications   aspirin 81 MG tablet Take 81 mg by mouth daily.   atorvastatin 10 MG  tablet Commonly known as:  LIPITOR Take 0.5 tablets (5 mg total) by mouth daily.   CALCIUM-VITAMIN D PO Take 1 tablet by mouth daily.   Fish Oil 1000 MG Caps Take 1,000 mg by mouth 2 (two) times daily.   fluticasone 50 MCG/ACT nasal spray Commonly known as:  FLONASE USE 1 SPRAY IN EACH NOSTRIL TWICE A DAY AS NEEDED FOR ALLERGIES OR RHINITIS.   L-Lysine 500 MG Tabs Take 500 mg by mouth daily.   losartan-hydrochlorothiazide 100-25 MG tablet Commonly known as:  HYZAAR TAKE 1 TABLET DAILY   metoprolol tartrate 25 MG tablet Commonly known as:  LOPRESSOR Take 1 tablet (25 mg total) by mouth 2 (two) times daily.   VIACTIV MULTI-VITAMIN PO Take 1 tablet by mouth daily.   vitamin C 500 MG tablet Commonly known as:  ASCORBIC ACID Take 500 mg by mouth daily.   Vitamin D3 1000 units Caps Take 1,000 mg by mouth daily.   vitamin E 400 UNIT capsule Generic drug:  vitamin E Take 400 Units by mouth daily.   ZYRTEC ALLERGY 10 MG tablet Generic drug:  cetirizine Take 10 mg by mouth daily as needed for allergies.      Follow-up Information    Binnie Rail, MD. Schedule an appointment as soon as possible for a visit in 1 week(s).   Specialty:  Internal Medicine Contact information: [place] [address] [phone]          Allergies  Allergen Reactions  . Doxycycline     REACTION: hives  . Metronidazole   . Penicillins   .  Propoxyphene N-Acetaminophen   . Sulfonamide Derivatives   . Pravastatin     11/2010 Leg weakness     Consultations:  Cardiology  Procedures/Studies: Dg Chest 2 View  Result Date: 06/17/2016 CLINICAL DATA:  Mid chest pain tonight. EXAM: CHEST  2 VIEW COMPARISON:  03/20/2015 FINDINGS: Normal heart size and pulmonary vascularity. Emphysematous changes in the lungs. Slight fibrosis in the lung bases. No focal airspace disease or consolidation in the lungs. No blunting of costophrenic angles. No pneumothorax. Calcification of the  aorta. Degenerative changes in the spine. IMPRESSION: Emphysematous changes in the lungs. No evidence of active pulmonary disease. Electronically Signed   By: Lucienne Capers M.D.   On: 06/17/2016 00:59   Echo 11/21  Study Conclusions  - Left ventricle: The cavity size was normal. Wall thickness was  increased in a pattern of mild LVH. Systolic function was  vigorous. The estimated ejection fraction was in the range of 65%  to 70%. Wall motion was normal; there were no regional wall  motion abnormalities. Doppler parameters are consistent with  abnormal left ventricular relaxation (grade 1 diastolic  dysfunction).- Aortic valve: There was mild stenosis. There was mild  regurgitation. - Mitral valve: Calcified annulus. - Pulmonary arteries: Systolic pressure was mildly increased. PA  peak pressure: 31 mm Hg (S).  Impressions: - Vigorous LV systolic function; grade 1 diastolic dysfunction;  elevated LVOT velocity (2.7 m/s) suggests mild AS but visually  aortic valve opens well; mild AI; mild TR; mildly elevated  pulmonary pressure.   Discharge Exam: Vitals:   06/17/16 0409 06/17/16 1429  BP: (!) 128/47   Pulse: (!) 59 60  Resp: 20 18  Temp: 98.1 F (36.7 C) 98 F (36.7 C)   Vitals:   06/17/16 0230 06/17/16 0301 06/17/16 0409 06/17/16 1429  BP: 128/85 (!) 148/85 (!) 128/47   Pulse:  (!) 59 (!) 59 60  Resp: 14 20 20 18   Temp:  97 F (36.1 C) 98.1 F (36.7 C) 98 F (36.7 C)  TempSrc:  Oral Oral Oral  SpO2:  100% 100% 100%  Weight:  69.1 kg (152 lb 6.4 oz)    Height:  5\' 5"  (1.651 m)      General: Pt is alert, awake, not in acute distress Cardiovascular: RRR, S1/S2 +, no rubs, no gallops Respiratory: CTA bilaterally, no wheezing, no rhonchi Abdominal: Soft, NT, ND, bowel sounds + Extremities: no edema, no cyanosis    The results of significant diagnostics from this hospitalization (including imaging, microbiology, ancillary and laboratory) are listed below for reference.      Microbiology: No results found for this or any previous visit (from the past 240 hour(s)).   Labs: BNP (last 3 results) No results for input(s): BNP in the last 8760 hours. Basic Metabolic Panel:  Recent Labs Lab 06/17/16 0109 06/17/16 0421  NA 131*  --   K 3.7  --   CL 98*  --   CO2 26  --   GLUCOSE 108*  --   BUN 15  --   CREATININE 0.87 0.80  CALCIUM 9.7  --    Liver Function Tests: No results for input(s): AST, ALT, ALKPHOS, BILITOT, PROT, ALBUMIN in the last 168 hours. No results for input(s): LIPASE, AMYLASE in the last 168 hours. No results for input(s): AMMONIA in the last 168 hours. CBC:  Recent Labs Lab 06/17/16 0109 06/17/16 0421  WBC 8.6 7.5  HGB 12.2 11.5*  HCT 35.0* 33.8*  MCV 91.4 90.9  PLT  213 201   Cardiac Enzymes:  Recent Labs Lab 06/17/16 0421 06/17/16 0941  TROPONINI <0.03 <0.03   BNP: Invalid input(s): POCBNP CBG: No results for input(s): GLUCAP in the last 168 hours. D-Dimer No results for input(s): DDIMER in the last 72 hours. Hgb A1c No results for input(s): HGBA1C in the last 72 hours. Lipid Profile No results for input(s): CHOL, HDL, LDLCALC, TRIG, CHOLHDL, LDLDIRECT in the last 72 hours. Thyroid function studies No results for input(s): TSH, T4TOTAL, T3FREE, THYROIDAB in the last 72 hours.  Invalid input(s): FREET3 Anemia work up No results for input(s): VITAMINB12, FOLATE, FERRITIN, TIBC, IRON, RETICCTPCT in the last 72 hours. Urinalysis    Component Value Date/Time   COLORURINE yellow 09/27/2009 0753   APPEARANCEUR Clear 09/27/2009 0753   LABSPEC <1.005 09/27/2009 0753   PHURINE 6.5 09/27/2009 0753   HGBUR negative 09/27/2009 0753   BILIRUBINUR negative 09/27/2009 0753   UROBILINOGEN 0.2 09/27/2009 0753   NITRITE negative 09/27/2009 0753   Sepsis Labs Invalid input(s): PROCALCITONIN,  WBC,  LACTICIDVEN Microbiology No results found for this or any previous visit (from the past 240 hour(s)).   Time  coordinating discharge: Over 30 minutes  SIGNED:  Dessa Phi, DO [facility] Pager [phone]  If 7PM-7AM, please contact night-coverage [email or web address] Password [facility] 06/17/2016, 5:43 PM

## 2016-06-17 NOTE — ED Notes (Signed)
Phlebotomy at bedside.

## 2016-06-17 NOTE — ED Notes (Signed)
Pt took 3 ASAS with water prior to calling 911 and then 29 old her to take 4 more. Pt has had 7 ASA.

## 2016-06-17 NOTE — ED Provider Notes (Signed)
By signing my name below, I, Denise Wagner, attest that this documentation has been prepared under the direction and in the presence of [employer], DO. Electronically Signed: Doran Wagner, ED Scribe. 06/17/16. 12:59 AM.  TIME SEEN: 12:59 AM   CHIEF COMPLAINT:  Chief Complaint  Patient presents with  . Chest Pain   HPI:  HPI Comments: Denise Wagner is a 82 y.o. female who presents to the Emergency Department with a PMHx of HLD, and HTN via EMS complaining of sudden central chest pain that began 4 hours prior to arrival. Pt states she was watching TV at 9 PM when her pain suddenly began. She states it radiated to her bilateral shoulders and describes her pain as "pressure" and "heavy feeling". Pt denies any aggravating factors. Pt took 3 baby aspirins and called 911. Pt was advised to take 4 more aspirin after which her symptoms were resolved. She is currently asymptomatic. Pt denies any SOB, N//V/D, fever, diaphoresis, dizziness, cough, Pt denies hx of PE or DVT. Pt denies hx of stress test, cardiac catheterizations. Pt is followed by Dr. Billey Gosling.   ROS: See HPI Constitutional: no fever  Eyes: no drainage  ENT: no runny nose   Cardiovascular:  chest pain  Resp: no SOB  GI: no vomiting GU: no dysuria Integumentary: no rash  Allergy: no hives  Musculoskeletal: no leg swelling  Neurological: no slurred speech ROS otherwise negative  PAST MEDICAL HISTORY/PAST SURGICAL HISTORY:  Past Medical History:  Diagnosis Date  . Hyperlipidemia   . Hypertension   . Osteopenia   . Vitamin D deficiency     MEDICATIONS:  Prior to Admission medications   Medication Sig Start Date End Date Taking? Authorizing Provider  aspirin 81 MG tablet Take 81 mg by mouth daily.      Historical Provider, MD  atorvastatin (LIPITOR) 10 MG tablet Take 0.5 tablets (5 mg total) by mouth daily. 10/22/15   Binnie Rail, MD  CALCIUM-VITAMIN D PO Take by mouth daily.    Historical Provider, MD   cetirizine (ZYRTEC ALLERGY) 10 MG tablet Take 10 mg by mouth as needed.      Historical Provider, MD  Cholecalciferol (VITAMIN D3) 1000 UNITS CAPS Take 1,000 mg by mouth daily.      Historical Provider, MD  fluticasone (FLONASE) 50 MCG/ACT nasal spray USE 1 SPRAY IN EACH NOSTRIL TWICE A DAY AS NEEDED FOR ALLERGIES OR RHINITIS. 04/01/16   Binnie Rail, MD  L-Lysine 500 MG TABS Take 500 mg by mouth daily.      Historical Provider, MD  losartan-hydrochlorothiazide (HYZAAR) 100-25 MG tablet TAKE 1 TABLET DAILY 04/29/16   Binnie Rail, MD  metoprolol tartrate (LOPRESSOR) 25 MG tablet Take 1 tablet (25 mg total) by mouth 2 (two) times daily. 08/20/15   Binnie Rail, MD  Multiple Vitamins-Calcium (VIACTIV MULTI-VITAMIN PO) Take by mouth daily.      Historical Provider, MD  Omega-3 Fatty Acids (FISH OIL) 1000 MG CAPS Take 1,000 mg by mouth. Take two daily     Historical Provider, MD  vitamin C (ASCORBIC ACID) 500 MG tablet Take 500 mg by mouth daily. LIQUID GELS...1 by mouth once daily     Historical Provider, MD  vitamin E (VITAMIN E) 400 UNIT capsule Take 400 Units by mouth daily.      Historical Provider, MD    ALLERGIES:  Allergies  Allergen Reactions  . Doxycycline     REACTION: hives  . Metronidazole   .  Penicillins   . Propoxyphene N-Acetaminophen   . Sulfonamide Derivatives   . Pravastatin     11/2010 Leg weakness     SOCIAL HISTORY:  Social History  Substance Use Topics  . Smoking status: Never Smoker  . Smokeless tobacco: Never Used  . Alcohol use Yes     Comment: rarely    FAMILY HISTORY: Family History  Problem Relation Age of Onset  . Arthritis Mother   . Heart disease Brother     MI in 56's  . Diabetes Maternal Grandmother   . Heart disease Cousin     MI  . Breast cancer Sister   . Stroke Neg Hx     EXAM: BP 135/62   Pulse 75   Temp 97.7 F (36.5 C) (Oral)   Resp 16   Ht 5\' 6"  (1.676 m)   Wt 150 lb (68 kg)   SpO2 100%   BMI 24.21 kg/m  CONSTITUTIONAL:  Alert and oriented and responds appropriately to questions. Well-appearing; well-nourished, elderly HEAD: Normocephalic EYES: Conjunctivae clear, PERRL, EOMI ENT: normal nose; no rhinorrhea; moist mucous membranes NECK: Supple, no meningismus, no nuchal rigidity, no LAD  CARD: RRR; S1 and S2 appreciated; no murmurs, no clicks, no rubs, no gallops RESP: Normal chest excursion without splinting or tachypnea; breath sounds clear and equal bilaterally; no wheezes, no rhonchi, no rales, no hypoxia or respiratory distress, speaking full sentences ABD/GI: Normal bowel sounds; non-distended; soft, non-tender, no rebound, no guarding, no peritoneal signs, no hepatosplenomegaly BACK:  The back appears normal and is non-tender to palpation, there is no CVA tenderness EXT: Normal ROM in all joints; non-tender to palpation; no edema; normal capillary refill; no cyanosis, no calf tenderness or swelling    SKIN: Normal color for age and race; warm; no rash NEURO: Moves all extremities equally, sensation to light touch intact diffusely, cranial nerves II through XII intact, normal speech PSYCH: The patient's mood and manner are appropriate. Grooming and personal hygiene are appropriate.  MEDICAL DECISION MAKING: Patient here with episode of chest pain. Does have risk factors of age, hypertension and hyperlipidemia. Completely chest pain-free at this time. First EKG shows atrial fibrillation versus normal sinus rhythm artifact. Will repeat EKG.  I have recommended admission for chest pain rule out which patient's family is comfortable with. They would prefer this over discharge. She has never had any provocative testing.  Doubt PE or dissection given patient pain-free at this time with no shortness of breath.  ED PROGRESS: 2:05 AM  Pt's labs are unremarkable. Troponin negative. Chest x-ray clear. Still asymptomatic.   Discussed patient's case with hospitalist, Dr. Hal Hope.  Recommend admission to telemetry,  observation bed.  I will place holding orders per their request. Patient and family (if present) updated with plan. Care transferred to hospitalist service.  I reviewed all nursing notes, vitals, pertinent old records, EKGs, labs, imaging (as available).      EKG Interpretation  Date/Time:  Tuesday June 17 2016 00:16:44 EST Ventricular Rate:  72 PR Interval:    QRS Duration: 96 QT Interval:  396 QTC Calculation: 434 R Axis:   -23 Text Interpretation:  Atrial fibrillation versus NSR with artifact Abnormal R-wave progression, early transition LVH with secondary repolarization abnormality No old tracing to compare Needs repeat EKG Confirmed by WARD,  DO, KRISTEN [phone]) on 06/17/2016 12:28:45 AM        EKG Interpretation  Date/Time:  Tuesday June 17 2016 01:13:04 EST Ventricular Rate:  61 PR Interval:  QRS Duration: 98 QT Interval:  413 QTC Calculation: 416 R Axis:   -23 Text Interpretation:  Sinus rhythm Abnormal R-wave progression, early transition Left ventricular hypertrophy No old tracing to compare Confirmed by WARD,  DO, KRISTEN ([id number]) on 06/17/2016 1:17:12 AM        I personally performed the services described in this documentation, which was scribed in my presence. The recorded information has been reviewed and is accurate.    [place], DO 06/17/16 [phone]

## 2016-06-17 NOTE — Care Management Obs Status (Signed)
[facility] NOTIFICATION   Patient Details  Name: Denise Wagner MRN: [id number] Date of Birth: [birth year]   Medicare Observation Status Notification Given:  Yes    Dawayne Patricia, RN 06/17/2016, 4:27 PM

## 2016-06-17 NOTE — Progress Notes (Signed)
  Echocardiogram 2D Echocardiogram has been performed.  Denise Wagner 06/17/2016, 11:10 AM

## 2016-06-17 NOTE — ED Triage Notes (Signed)
Per [facility]  Pt coming from home. Sub central CP while at rest 6/10 No n/v or diaphoresis or SOB. Pt took 2 81 mg asa with no relief. Call center told her to take 4 more ASA. NO trauma or physical activity.

## 2016-06-17 NOTE — H&P (Signed)
History and Physical    Denise Wagner [id number] DOB: 03/16/1924 DOA: 06/17/2016  PCP: Binnie Rail, MD  Patient coming from: Home.  Chief Complaint: Chest pain.  HPI: Denise Wagner is a 82 y.o. female with hypertension and hyperlipidemia started experiencing chest pain last evening while watching TV. Pain was retrosternal radiating to her neck. Patient states the pain was an uneasy feeling with no associated shortness of breath diaphoresis nausea or vomiting. Patient took aspirin and called her nephew. Since the pain was not getting better they later called the EMS. By the time patient reached ER chest pain improved. Patient is being admitted for further chest pain workup.   ED Course: EKG shows normal sinus rhythm with LVH. Chest x-ray shows COPD features. Troponins negative.  Review of Systems: As per HPI, rest all negative.   Past Medical History:  Diagnosis Date  . Hyperlipidemia   . Hypertension   . Osteopenia   . Vitamin D deficiency     Past Surgical History:  Procedure Laterality Date  . cataract surgery  2007   bilaterally  . gravida 0,para 0,mis 0, Dr.Gottsegen    . HEMORRHOID SURGERY    . no colonoscopy     "I never had a problem" ([place] reviewed)  . synvisc injections; r knee     Dr Lynann Bologna     reports that she has never smoked. She has never used smokeless tobacco. She reports that she drinks alcohol. She reports that she does not use drugs.  Allergies  Allergen Reactions  . Doxycycline     REACTION: hives  . Metronidazole   . Penicillins   . Propoxyphene N-Acetaminophen   . Sulfonamide Derivatives   . Pravastatin     11/2010 Leg weakness     Family History  Problem Relation Age of Onset  . Arthritis Mother   . Heart disease Brother     MI in 12's  . Diabetes Maternal Grandmother   . Heart disease Cousin     MI  . Breast cancer Sister   . Stroke Neg Hx     Prior to Admission medications   Medication Sig Start Date End Date  Taking? Authorizing Provider  aspirin 81 MG tablet Take 81 mg by mouth daily.     Yes Historical Provider, MD  CALCIUM-VITAMIN D PO Take 1 tablet by mouth daily.    Yes Historical Provider, MD  cetirizine (ZYRTEC ALLERGY) 10 MG tablet Take 10 mg by mouth daily as needed for allergies.    Yes Historical Provider, MD  Cholecalciferol (VITAMIN D3) 1000 UNITS CAPS Take 1,000 mg by mouth daily.     Yes Historical Provider, MD  fluticasone (FLONASE) 50 MCG/ACT nasal spray USE 1 SPRAY IN EACH NOSTRIL TWICE A DAY AS NEEDED FOR ALLERGIES OR RHINITIS. 04/01/16  Yes Binnie Rail, MD  L-Lysine 500 MG TABS Take 500 mg by mouth daily.     Yes Historical Provider, MD  losartan-hydrochlorothiazide (HYZAAR) 100-25 MG tablet TAKE 1 TABLET DAILY 04/29/16  Yes Binnie Rail, MD  metoprolol tartrate (LOPRESSOR) 25 MG tablet Take 1 tablet (25 mg total) by mouth 2 (two) times daily. 08/20/15  Yes Binnie Rail, MD  Multiple Vitamins-Calcium (VIACTIV MULTI-VITAMIN PO) Take 1 tablet by mouth daily.    Yes Historical Provider, MD  Omega-3 Fatty Acids (FISH OIL) 1000 MG CAPS Take 1,000 mg by mouth 2 (two) times daily.    Yes Historical Provider, MD  vitamin C (ASCORBIC ACID)  500 MG tablet Take 500 mg by mouth daily.    Yes Historical Provider, MD  vitamin E (VITAMIN E) 400 UNIT capsule Take 400 Units by mouth daily.     Yes Historical Provider, MD  atorvastatin (LIPITOR) 10 MG tablet Take 0.5 tablets (5 mg total) by mouth daily. Patient not taking: Reported on 06/17/2016 10/22/15   Binnie Rail, MD    Physical Exam: Vitals:   06/17/16 0115 06/17/16 0145 06/17/16 0230 06/17/16 0301  BP:  139/61 128/85 (!) 148/85  Pulse: (!) 59 60  (!) 59  Resp: 18 16 14 20   Temp:    97 F (36.1 C)  TempSrc:    Oral  SpO2: 100% 100%  100%  Weight:    69.1 kg (152 lb 6.4 oz)  Height:    5\' 5"  (1.651 m)      Constitutional: Moderately built and nourished. Vitals:   06/17/16 0115 06/17/16 0145 06/17/16 0230 06/17/16 0301  BP:   139/61 128/85 (!) 148/85  Pulse: (!) 59 60  (!) 59  Resp: 18 16 14 20   Temp:    97 F (36.1 C)  TempSrc:    Oral  SpO2: 100% 100%  100%  Weight:    69.1 kg (152 lb 6.4 oz)  Height:    5\' 5"  (1.651 m)   Eyes: Anicteric. No pallor. ENMT: No discharge from the ears eyes nose and mouth. Neck: No mass felt. No neck rigidity. Respiratory: No rhonchi or crepitations. Cardiovascular: S1 and S2 heard. No murmurs appreciated. Abdomen: Soft nontender bowel sounds present. Musculoskeletal: No edema. No joint effusion. Skin: No rash. Skin appears warm. Neurologic: Alert awake oriented to time place and person. Moves all extremities. Psychiatric: Appears normal. Normal affect.   Labs on Admission: I have personally reviewed following labs and imaging studies  CBC:  Recent Labs Lab 06/17/16 0109  WBC 8.6  HGB 12.2  HCT 35.0*  MCV 91.4  PLT 123456   Basic Metabolic Panel:  Recent Labs Lab 06/17/16 0109  NA 131*  K 3.7  CL 98*  CO2 26  GLUCOSE 108*  BUN 15  CREATININE 0.87  CALCIUM 9.7   GFR: Estimated Creatinine Clearance: 41.9 mL/min (by C-G formula based on SCr of 0.87 mg/dL). Liver Function Tests: No results for input(s): AST, ALT, ALKPHOS, BILITOT, PROT, ALBUMIN in the last 168 hours. No results for input(s): LIPASE, AMYLASE in the last 168 hours. No results for input(s): AMMONIA in the last 168 hours. Coagulation Profile: No results for input(s): INR, PROTIME in the last 168 hours. Cardiac Enzymes: No results for input(s): CKTOTAL, CKMB, CKMBINDEX, TROPONINI in the last 168 hours. BNP (last 3 results) No results for input(s): PROBNP in the last 8760 hours. HbA1C: No results for input(s): HGBA1C in the last 72 hours. CBG: No results for input(s): GLUCAP in the last 168 hours. Lipid Profile: No results for input(s): CHOL, HDL, LDLCALC, TRIG, CHOLHDL, LDLDIRECT in the last 72 hours. Thyroid Function Tests: No results for input(s): TSH, T4TOTAL, FREET4, T3FREE,  THYROIDAB in the last 72 hours. Anemia Panel: No results for input(s): VITAMINB12, FOLATE, FERRITIN, TIBC, IRON, RETICCTPCT in the last 72 hours. Urine analysis:    Component Value Date/Time   COLORURINE yellow 09/27/2009 0753   APPEARANCEUR Clear 09/27/2009 0753   LABSPEC <1.005 09/27/2009 0753   PHURINE 6.5 09/27/2009 0753   HGBUR negative 09/27/2009 0753   BILIRUBINUR negative 09/27/2009 0753   UROBILINOGEN 0.2 09/27/2009 0753   NITRITE negative 09/27/2009 0753  Sepsis Labs: @LABRCNTIP (procalcitonin:4,lacticidven:4) )No results found for this or any previous visit (from the past 240 hour(s)).   Radiological Exams on Admission: Dg Chest 2 View  Result Date: 06/17/2016 CLINICAL DATA:  Mid chest pain tonight. EXAM: CHEST  2 VIEW COMPARISON:  03/20/2015 FINDINGS: Normal heart size and pulmonary vascularity. Emphysematous changes in the lungs. Slight fibrosis in the lung bases. No focal airspace disease or consolidation in the lungs. No blunting of costophrenic angles. No pneumothorax. Calcification of the aorta. Degenerative changes in the spine. IMPRESSION: Emphysematous changes in the lungs. No evidence of active pulmonary disease. Electronically Signed   By: Lucienne Capers M.D.   On: 06/17/2016 00:59    EKG: Independently reviewed. Normal sinus rhythm with LVH.  Assessment/Plan Principal Problem:   Chest pain Active Problems:   Essential hypertension    1. Chest pain - at this time patient is chest pain free. Given the age history of hypertension and hyperlipidemia will cycle cardiac markers to rule out ACS. Check 2-D echo. Will keep patient nothing by mouth in a.m. in anticipation of cardiac procedure. Aspirin. 2. Hypertension on losartan hydrochlorothiazide and metoprolol. 3. Hyperlipidemia on fish oil. Patient is intolerant to statins.   DVT prophylaxis: Lovenox. Code Status: Full code.  Family Communication: Discussed with patient.  Disposition Plan: Home.    Consults called: None.  Admission status: Observation.    Rise Patience MD [facility] Pager [phone].  If 7PM-7AM, please contact night-coverage [email or web address] Password [email or web address]  06/17/2016, 3:55 AM

## 2016-06-17 NOTE — Consult Note (Signed)
Cardiology Consult    Patient ID: Denise Wagner MRN: [id number], DOB/AGE: [birth year]   Admit date: 06/17/2016 Date of Consult: 06/17/2016  Primary Physician: Binnie Rail, MD Primary Cardiologist: New Requesting Provider: Dr. Maylene Roes Reason for Consultation: Chest pain  Patient Profile    82 yo female with PMH of HTN, and HLD who presented with chest pain last night while watching TV.   Past Medical History   Past Medical History:  Diagnosis Date  . Hyperlipidemia   . Hypertension   . Osteopenia   . Vitamin D deficiency     Past Surgical History:  Procedure Laterality Date  . cataract surgery  2007   bilaterally  . gravida 0,para 0,mis 0, Dr.Gottsegen    . HEMORRHOID SURGERY    . no colonoscopy     "I never had a problem" ([place] reviewed)  . synvisc injections; r knee     Dr Lynann Bologna     Allergies  Allergies  Allergen Reactions  . Doxycycline     REACTION: hives  . Metronidazole   . Penicillins   . Propoxyphene N-Acetaminophen   . Sulfonamide Derivatives   . Pravastatin     11/2010 Leg weakness     History of Present Illness    Denise Wagner is a 82 yo female with PMH of HTN and HLD. States she has never had a cardiac work up in the past. Only family hx is a brother who had an MI in his 43s. Reports she lives independently and is very active. Normally participates in water aerobics, but has been having knee pain and unable to do so in the past couple of months. She is compliant with blood pressure medications, but has stopped taking statins as she had significant muscle pain while on them.   Reports she was sitting watching TV last night when she developed a sudden onset of SScp with some radiation into her neck that lasted for about an hour. Denies any radiation into arms, n/v or diaphoresis. She took 3 81mg  ASA with water and called her nephew. Since pain persisted, she called EMS and was instructed to chew 4 81mg  ASA. Reports by the time EMS arrived the pain  had subsided.   In the ED labs showed trop negx2, slight hyponatremia, Hgb 11.5. CXR showed no acute disease. EKG showed SR rate 61 without acute ST/T wave changes. She was admitted for further chest pain work up.   Inpatient Medications    . aspirin EC  325 mg Oral Daily  . enoxaparin (LOVENOX) injection  40 mg Subcutaneous Q24H  . fluticasone  2 spray Each Nare Daily  . losartan  100 mg Oral Daily   And  . hydrochlorothiazide  25 mg Oral Daily  . loratadine  10 mg Oral Daily  . metoprolol tartrate  25 mg Oral BID  . omega-3 acid ethyl esters  1,000 mg Oral Daily    Family History    Family History  Problem Relation Age of Onset  . Arthritis Mother   . Heart disease Brother     MI in 47's  . Diabetes Maternal Grandmother   . Heart disease Cousin     MI  . Breast cancer Sister   . Stroke Neg Hx     Social History    Social History   Social History  . Marital status: Widowed    Spouse name: N/A  . Number of children: N/A  . Years of education: N/A   Occupational  History  . retired Retired   Social History Main Topics  . Smoking status: Never Smoker  . Smokeless tobacco: Never Used  . Alcohol use Yes     Comment: rarely  . Drug use: No  . Sexual activity: Not on file   Other Topics Concern  . Not on file   Social History Narrative   No diet           Review of Systems    General:  No chills, fever, night sweats or weight changes.  Cardiovascular:  See HPI Dermatological: No rash, lesions/masses Respiratory: No cough, dyspnea Urologic: No hematuria, dysuria Abdominal:   No nausea, vomiting, diarrhea, bright red blood per rectum, melena, or hematemesis Neurologic:  No visual changes, wkns, changes in mental status. All other systems reviewed and are otherwise negative except as noted above.  Physical Exam    Blood pressure (!) 128/47, pulse (!) 59, temperature 98.1 F (36.7 C), temperature source Oral, resp. rate 20, height 5\' 5"  (1.651 m),  weight 152 lb 6.4 oz (69.1 kg), SpO2 100 %.  General: Pleasant older WF, NAD Psych: Normal affect. Neuro: Alert and oriented X 3. Moves all extremities spontaneously. HEENT: Normal  Neck: Supple without bruits or JVD. Lungs:  Resp regular and unlabored, CTA. Heart: RRR no s3, s4, or murmurs. Abdomen: Soft, non-tender, non-distended, BS + x 4.  Extremities: No clubbing, cyanosis or edema. DP/PT/Radials 2+ and equal bilaterally.  Labs    Troponin [facility] of Care Test)  Recent Labs  06/17/16 0112  TROPIPOC 0.00    Recent Labs  06/17/16 0421 06/17/16 0941  TROPONINI <0.03 <0.03   Lab Results  Component Value Date   WBC 7.5 06/17/2016   HGB 11.5 (L) 06/17/2016   HCT 33.8 (L) 06/17/2016   MCV 90.9 06/17/2016   PLT 201 06/17/2016    Recent Labs Lab 06/17/16 0109 06/17/16 0421  NA 131*  --   K 3.7  --   CL 98*  --   CO2 26  --   BUN 15  --   CREATININE 0.87 0.80  CALCIUM 9.7  --   GLUCOSE 108*  --    Lab Results  Component Value Date   CHOL 221 (H) 10/04/2015   HDL 52.30 10/04/2015   LDLCALC 149 (H) 10/04/2015   TRIG 98.0 10/04/2015   No results found for: [facility]   Radiology Studies    Dg Chest 2 View  Result Date: 06/17/2016 CLINICAL DATA:  Mid chest pain tonight. EXAM: CHEST  2 VIEW COMPARISON:  03/20/2015 FINDINGS: Normal heart size and pulmonary vascularity. Emphysematous changes in the lungs. Slight fibrosis in the lung bases. No focal airspace disease or consolidation in the lungs. No blunting of costophrenic angles. No pneumothorax. Calcification of the aorta. Degenerative changes in the spine. IMPRESSION: Emphysematous changes in the lungs. No evidence of active pulmonary disease. Electronically Signed   By: Lucienne Capers M.D.   On: 06/17/2016 00:59    ECG & Cardiac Imaging    EKG: SR  Echo: Pending.  Assessment & Plan    82 yo female with PMH of HTN, and HLD who presented with chest pain last night while watching TV.  1. Chest pain:  Reports sudden onset of SScp while watching TV. States this is the first time that she has experienced chest pain. Generally very active, lives independently. Denies any associated symptoms with this episode. Trops neg x2 thus far. She has had not further episodes of chest pain since  admission. EKG is SR without acute ST/T wave changes. -- Check 2D echo for EF and WMA. Could consider outpatient stress test as she does have risk factors of HTN/HLD (not currently on statin), will discuss with MD.   2. HTN: Well controlled. Continue current medical therapy.    Barnet Pall, NP-C Pager [phone] 06/17/2016, 10:38 AM   The patient was seen, examined and discussed with Reino Bellis, NP-C and I agree with the above.   Denise Wagner is a 82 yo female with PMH of HTN and HLD with no prior cardiac history who presented with chest pain at rest. The patient remains very independent and active despite her age. He even participates in gym classes. Her pain was retrosternal with radiation to her neck, not associated with any other symptoms such as shortness of breath nausea or vomiting or diaphoresis. Her pain subsided by the time EMS came. Troponin was negative 3. EKG shows normal sinus rhythm with no ischemic changes. She has hemoglobin of 11.5, normal kidney function, on only mild hypernatremia with sodium of 131. LDL is slightly elevated at 149, she no longer takes statins as she develop muscular pain in the past. Her echocardiogram shows hyperdynamic LV function 65-70% there is mild aortic stenosis with mild regurgitation and normal right-sided pressures.  The patient is currently asymptomatic, considering she is fully active and attending gym classes at age of 17 without any chest pain I will discharge home and not proceed with any ischemic workup unless she has any recurrent symptoms. We will arrange for follow-up in couple weeks from discharge, if she has any recurrent symptoms we'll  consider an outpatient stress testing.  Ena Dawley, MD 06/17/2016

## 2016-06-17 NOTE — ED Notes (Signed)
Pt's family requesting information on insurance and registration.  Registration notified, will come to assess.

## 2016-06-17 NOTE — ED Triage Notes (Signed)
Pt CP free now

## 2016-06-18 ENCOUNTER — Telehealth: Payer: Self-pay

## 2016-06-18 NOTE — Telephone Encounter (Signed)
Left message on machine for pt to return my call for TCM follow up. Pt has appt scheduled with PCP 07/08/2016 but per D/C follow appt needs to be the week of 06/30/2016

## 2016-06-23 NOTE — Progress Notes (Signed)
Subjective:    Patient ID: Denise Wagner, female    DOB: 06/21/1924, 82 y.o.   MRN: [id number]  Chief Complaint  Patient presents with  . Hospitalization Follow-up    blood pressure has been elevated    HPI:  Denise Wagner is a 82 y.o. female who  has a past medical history of Hyperlipidemia; Hypertension; Osteopenia; and Vitamin D deficiency. and presents today For a hospitalization follow-up.   Chest pain - recently evaluated in the emergency department and admitted to the hospital complaining of sudden central chest pain that began 4 hours prior to presentation. Described it radiating to her bilateral shoulders and described as a pressure/heavy feeling. Patient's labs were found to be unremarkable and troponins were negative. EKG showed atrial fibrillation versus normal sinus rhythm with artifact and abnormal R-wave progression with early transition left ventricular hypertrophy. She was admitted for observation. Echocardiogram performed on 11/21 showed ejection fraction 65-70% with no wall motion abnormalities and grade 1 diastolic dysfunction. Chest pain resolved without recurrence. Upon discharge she reported doing well with no complaints, chest pain, shortness of breath, nausea, or vomiting/diarrhea. All hospital records, labs, and imaging reviewed in detail.  Since leaving the hospital she reports that she has had no further episodes of chest pain or shortness of breath. Continues to take her medication as prescribed and denies adverse side effects. Blood pressure has been averaging on the higher side between 150-170. She is able to complete her activities of daily living at baseline. Denies worst headache of life or new symptoms of end organ damage.    Allergies  Allergen Reactions  . Doxycycline     REACTION: hives  . Metronidazole   . Penicillins   . Propoxyphene N-Acetaminophen   . Sulfonamide Derivatives   . Pravastatin     11/2010 Leg weakness      Outpatient  Medications Prior to Visit  Medication Sig Dispense Refill  . aspirin 81 MG tablet Take 81 mg by mouth daily.      Marland Kitchen atorvastatin (LIPITOR) 10 MG tablet Take 0.5 tablets (5 mg total) by mouth daily. 30 tablet 3  . CALCIUM-VITAMIN D PO Take 1 tablet by mouth daily.     . cetirizine (ZYRTEC ALLERGY) 10 MG tablet Take 10 mg by mouth daily as needed for allergies.     . Cholecalciferol (VITAMIN D3) 1000 UNITS CAPS Take 1,000 mg by mouth daily.      . fluticasone (FLONASE) 50 MCG/ACT nasal spray USE 1 SPRAY IN EACH NOSTRIL TWICE A DAY AS NEEDED FOR ALLERGIES OR RHINITIS. 48 g 1  . L-Lysine 500 MG TABS Take 500 mg by mouth daily.      Marland Kitchen losartan-hydrochlorothiazide (HYZAAR) 100-25 MG tablet TAKE 1 TABLET DAILY 90 tablet 1  . metoprolol tartrate (LOPRESSOR) 25 MG tablet Take 1 tablet (25 mg total) by mouth 2 (two) times daily. 180 tablet 3  . Multiple Vitamins-Calcium (VIACTIV MULTI-VITAMIN PO) Take 1 tablet by mouth daily.     . Omega-3 Fatty Acids (FISH OIL) 1000 MG CAPS Take 1,000 mg by mouth 2 (two) times daily.     . vitamin C (ASCORBIC ACID) 500 MG tablet Take 500 mg by mouth daily.     . vitamin E (VITAMIN E) 400 UNIT capsule Take 400 Units by mouth daily.       No facility-administered medications prior to visit.       Past Surgical History:  Procedure Laterality Date  . cataract surgery  2007  bilaterally  . gravida 0,para 0,mis 0, Dr.Gottsegen    . HEMORRHOID SURGERY    . no colonoscopy     "I never had a problem" ([place] reviewed)  . synvisc injections; r knee     Dr Lynann Bologna      Past Medical History:  Diagnosis Date  . Hyperlipidemia   . Hypertension   . Osteopenia   . Vitamin D deficiency     Review of Systems  Constitutional: Negative for chills and fever.  Eyes:       Negative for changes in vision  Respiratory: Negative for cough, chest tightness and wheezing.   Cardiovascular: Negative for chest pain, palpitations and leg swelling.  Neurological: Negative for  dizziness, weakness and light-headedness.      Objective:    BP (!) 176/72 (BP Location: Left Arm, Patient Position: Sitting, Cuff Size: Normal)   Pulse (!) 59   Temp 97.5 F (36.4 C) (Oral)   Resp 14   Ht 5\' 5"  (1.651 m)   Wt 153 lb (69.4 kg)   SpO2 95%   BMI 25.46 kg/m  Nursing note and vital signs reviewed.  Physical Exam  Constitutional: She is oriented to person, place, and time. She appears well-developed and well-nourished. She is cooperative.  Non-toxic appearance. She does not have a sickly appearance. She does not appear ill. No distress.  Cardiovascular: Normal rate, regular rhythm, normal heart sounds and intact distal pulses.   Pulmonary/Chest: Effort normal and breath sounds normal.  Neurological: She is alert and oriented to person, place, and time.  Skin: Skin is warm and dry.  Psychiatric: She has a normal mood and affect. Her behavior is normal. Judgment and thought content normal.       Assessment & Plan:   Problem List Items Addressed This Visit      Cardiovascular and Mediastinum   Essential hypertension - Primary    Blood pressure remains elevated above goal 150/90 with current regimen both in office and at home. No new symptoms of end organ damage noted upon physical exam and denies worse headache of life. Continue current dosage of losartan-hydrochlorothiazide and metoprolol. Start amlodipine. Encouraged monitor blood pressure at home and follow low-sodium diet. Continue to monitor.      Relevant Medications   amLODipine (NORVASC) 5 MG tablet     Other   Chest pain    No further evidence of chest pain, shortness of breath, or heart palpitations. Previous cardiac workup with no significant findings. No further treatment is necessary at this time. Continue to monitor and follow-up with cardiology as scheduled.          I am having Ms. Scheper start on amLODipine. I am also having her maintain her aspirin, Fish Oil, L-Lysine, Multiple  Vitamins-Calcium (VIACTIV MULTI-VITAMIN PO), Vitamin D3, vitamin E, vitamin C, cetirizine, metoprolol tartrate, CALCIUM-VITAMIN D PO, atorvastatin, fluticasone, and losartan-hydrochlorothiazide.   Meds ordered this encounter  Medications  . amLODipine (NORVASC) 5 MG tablet    Sig: Take 1 tablet (5 mg total) by mouth daily.    Dispense:  30 tablet    Refill:  1    Order Specific Question:   Supervising Provider    Answer:   Pricilla Holm A [id number]     Follow-up: Return if symptoms worsen or fail to improve.  Mauricio Po, FNP

## 2016-06-24 ENCOUNTER — Ambulatory Visit (INDEPENDENT_AMBULATORY_CARE_PROVIDER_SITE_OTHER): Payer: Medicare Other | Admitting: Family

## 2016-06-24 ENCOUNTER — Encounter: Payer: Self-pay | Admitting: Family

## 2016-06-24 VITALS — BP 176/72 | HR 59 | Temp 97.5°F | Resp 14 | Ht 65.0 in | Wt 153.0 lb

## 2016-06-24 DIAGNOSIS — I10 Essential (primary) hypertension: Secondary | ICD-10-CM | POA: Diagnosis not present

## 2016-06-24 DIAGNOSIS — R072 Precordial pain: Secondary | ICD-10-CM

## 2016-06-24 MED ORDER — AMLODIPINE BESYLATE 5 MG PO TABS: 5.0000 mg | ORAL_TABLET | Freq: Every day | ORAL | 1 refills | Status: DC

## 2016-06-24 NOTE — Assessment & Plan Note (Signed)
Blood pressure remains elevated above goal 150/90 with current regimen both in office and at home. No new symptoms of end organ damage noted upon physical exam and denies worse headache of life. Continue current dosage of losartan-hydrochlorothiazide and metoprolol. Start amlodipine. Encouraged monitor blood pressure at home and follow low-sodium diet. Continue to monitor.

## 2016-06-24 NOTE — Assessment & Plan Note (Signed)
No further evidence of chest pain, shortness of breath, or heart palpitations. Previous cardiac workup with no significant findings. No further treatment is necessary at this time. Continue to monitor and follow-up with cardiology as scheduled.

## 2016-06-24 NOTE — Patient Instructions (Addendum)
Thank you for choosing [facility].  SUMMARY AND INSTRUCTIONS:  Continue to monitor your blood pressure at home.   Follow a low sodium diet.    Medication:  Please start taking the amlodipine.   Your prescription(s) have been submitted to your pharmacy or been printed and provided for you. Please take as directed and contact our office if you believe you are having problem(s) with the medication(s) or have any questions.  Follow up:  If your symptoms worsen or fail to improve, please contact our office for further instruction, or in case of emergency go directly to the emergency room at the closest medical facility.

## 2016-06-26 ENCOUNTER — Telehealth: Payer: Self-pay | Admitting: Family

## 2016-06-26 NOTE — Telephone Encounter (Signed)
Please have her discontinue the medication at this time and continue to monitor her blood pressure. If they are elevated we may need to consider a different medication.

## 2016-06-26 NOTE — Telephone Encounter (Signed)
Please advise 

## 2016-06-26 NOTE — Telephone Encounter (Signed)
Patient states that Marya Amsler gave her a new prescription for BP.  Patient states she does not know the name of this medication.  Patient states it makes her head hurt and causes her to be nervous.  Patient states she does not want to take this medication anymore.  Please follow up in regard.

## 2016-06-30 NOTE — Telephone Encounter (Signed)
LVM letting pt know.  

## 2016-07-03 ENCOUNTER — Encounter: Payer: Self-pay | Admitting: Physician Assistant

## 2016-07-07 ENCOUNTER — Ambulatory Visit: Payer: Medicare Other

## 2016-07-08 ENCOUNTER — Inpatient Hospital Stay: Payer: Medicare Other | Admitting: Internal Medicine

## 2016-07-09 ENCOUNTER — Encounter: Payer: Self-pay | Admitting: Physician Assistant

## 2016-07-09 ENCOUNTER — Ambulatory Visit (INDEPENDENT_AMBULATORY_CARE_PROVIDER_SITE_OTHER): Payer: Medicare Other | Admitting: Physician Assistant

## 2016-07-09 VITALS — BP 162/60 | HR 66 | Ht 65.0 in | Wt 154.0 lb

## 2016-07-09 DIAGNOSIS — E785 Hyperlipidemia, unspecified: Secondary | ICD-10-CM | POA: Diagnosis not present

## 2016-07-09 DIAGNOSIS — I10 Essential (primary) hypertension: Secondary | ICD-10-CM

## 2016-07-09 DIAGNOSIS — R079 Chest pain, unspecified: Secondary | ICD-10-CM | POA: Diagnosis not present

## 2016-07-09 DIAGNOSIS — E871 Hypo-osmolality and hyponatremia: Secondary | ICD-10-CM

## 2016-07-09 DIAGNOSIS — I350 Nonrheumatic aortic (valve) stenosis: Secondary | ICD-10-CM

## 2016-07-09 NOTE — Progress Notes (Signed)
Cardiology Office Note    Date:  07/09/2016  ID:  Denise Wagner, DOB 01-31-1924, MRN [id number] PCP:  Binnie Rail, MD  Cardiologist: Dr. Meda Coffee   Chief Complaint: f/u chest pain  History of Present Illness:  Denise Wagner is a 82 y.o. female with history of HTN, hyperlipidemia, mild anemia who presents for post-hospital follow-up. She is usually quite active and attends aerobics classes. She was recently admitted 05/2016 with chest pain while watching TV with some radiation into her neck that lasted for about an hour. No radiation into arms, n/v or diaphoresis. Due to persistence of symptoms, she came to the ER where EKG showed NSR without acute ST/T changes (not atrial fib - EKG erroneously labeled), Labs showed negative trop x 4 slight hyponatremia, Hgb 11.5. CXR with emphysematous changes, no acute disease. 2D Echo 06/17/16: mild LVH, EF 65-70%, no RWMA, mild AS (but visually aortic valve opens well), mild TR, mildly elevated PASP. Last LDL 09/2015 was 149 with normal LFTs at that time.  She presents back for follow-up overall feeling well. She denies any further chest pain. No syncope, dyspnea, dizziness or nausea. She saw an NP at her PCP's office on 06/24/16 who recommended amlodipine for her blood pressure of 176/72. She took this for 2 days and it made her feel "nervous" so she stopped it. She has followed her blood pressures at home has had readings in 123456 systolic range, several of them >150. Blood pressure today is 162/60 with recheck of 172/76 after 10 minutes of resting quietly.   Past Medical History:  Diagnosis Date  . Hyperlipidemia   . Hypertension   . Mild anemia   . Osteopenia   . Vitamin D deficiency     Past Surgical History:  Procedure Laterality Date  . cataract surgery  2007   bilaterally  . gravida 0,para 0,mis 0, Dr.Gottsegen    . HEMORRHOID SURGERY    . no colonoscopy     "I never had a problem" ([place] reviewed)  . synvisc injections; r knee       Dr Lynann Bologna    Current Medications: Current Outpatient Prescriptions  Medication Sig Dispense Refill  . aspirin 81 MG tablet Take 81 mg by mouth daily.      Marland Kitchen CALCIUM-VITAMIN D PO Take 1 tablet by mouth daily.     . cetirizine (ZYRTEC ALLERGY) 10 MG tablet Take 10 mg by mouth daily as needed for allergies.     . Cholecalciferol (VITAMIN D3) 1000 UNITS CAPS Take 1,000 mg by mouth daily.      . fluticasone (FLONASE) 50 MCG/ACT nasal spray USE 1 SPRAY IN EACH NOSTRIL TWICE A DAY AS NEEDED FOR ALLERGIES OR RHINITIS. 48 g 1  . Ibuprofen (ADVIL PO) Take 1 tablet by mouth daily as needed (PAIN).    Marland Kitchen L-Lysine 500 MG TABS Take 500 mg by mouth daily.      Marland Kitchen losartan-hydrochlorothiazide (HYZAAR) 100-25 MG tablet TAKE 1 TABLET DAILY 90 tablet 1  . metoprolol tartrate (LOPRESSOR) 25 MG tablet Take 1 tablet (25 mg total) by mouth 2 (two) times daily. 180 tablet 3  . Multiple Vitamins-Calcium (VIACTIV MULTI-VITAMIN PO) Take 2 tablets by mouth daily.     . Omega-3 Fatty Acids (FISH OIL) 1000 MG CAPS Take 1,000 mg by mouth 2 (two) times daily.     . vitamin C (ASCORBIC ACID) 500 MG tablet Take 500 mg by mouth daily.     . vitamin E (VITAMIN E)  400 UNIT capsule Take 400 Units by mouth daily.       No current facility-administered medications for this visit.      Allergies:   Doxycycline; Metronidazole; Penicillins; Propoxyphene n-acetaminophen; Sulfonamide derivatives; and Pravastatin   Social History   Social History  . Marital status: Widowed    Spouse name: N/A  . Number of children: N/A  . Years of education: N/A   Occupational History  . retired Retired   Social History Main Topics  . Smoking status: Never Smoker  . Smokeless tobacco: Never Used  . Alcohol use Yes     Comment: rarely  . Drug use: No  . Sexual activity: Not Asked   Other Topics Concern  . None   Social History Narrative   No diet           Family History:  The patient's family history includes Arthritis in  her mother; Breast cancer in her sister; Diabetes in her maternal grandmother; Heart disease in her brother and cousin.   ROS:   Please see the history of present illness.  All other systems are reviewed and otherwise negative.    PHYSICAL EXAM:   VS:  BP (!) 162/60   Pulse 66   Ht 5\' 5"  (1.651 m)   Wt 154 lb (69.9 kg)   BMI 25.63 kg/m   BMI: Body mass index is 25.63 kg/m. GEN: Well nourished, well developed WF, in no acute distress. Looks younger than stated age 36: normocephalic, atraumatic Neck: no JVD, carotid bruits, or masses Cardiac: RRR; soft SEM RUSB. No rubs or gallops, no edema  Respiratory:  clear to auscultation bilaterally, normal work of breathing GI: soft, nontender, nondistended, + BS MS: no deformity or atrophy  Skin: warm and dry, no rash Neuro:  Alert and Oriented x 3, Strength and sensation are intact, follows commands Psych: euthymic mood, full affect  Wt Readings from Last 3 Encounters:  07/09/16 154 lb (69.9 kg)  06/24/16 153 lb (69.4 kg)  06/17/16 152 lb 6.4 oz (69.1 kg)      Studies/Labs Reviewed:   EKG:  EKG was ordered today and personally reviewed by me and demonstrates NSR 67bpm, moderate voltage criteria for LVH, TWI avL  Recent Labs: 10/04/2015: ALT 14; TSH 1.64 06/17/2016: BUN 15; Creatinine, Ser 0.80; Hemoglobin 11.5; Platelets 201; Potassium 3.7; Sodium 131   Lipid Panel    Component Value Date/Time   CHOL 221 (H) 10/04/2015 0806   TRIG 98.0 10/04/2015 0806   HDL 52.30 10/04/2015 0806   CHOLHDL 4 10/04/2015 0806   VLDL 19.6 10/04/2015 0806   LDLCALC 149 (H) 10/04/2015 0806   LDLDIRECT 152.8 08/26/2013 1051    Additional studies/ records that were reviewed today include: Summarized above.    ASSESSMENT & PLAN:   1. Chest pain - quiescent. Continue careful observation for recurrent symptoms. Continue risk factor modification. 2. Essential HTN - remains elevated. Agree with recent primary care note that amlodipine would be  helpful. We discussed restarting 1/2 dose at night (2.5mg ) daily. She can then titrate up to 5mg  nightly if she feels well and blood pressure does not go too low. Will schedule pharmD BP check in 10 days to reassess. Do not want to push up beta blocker therapy as her pulse is in the 60s at baseline. A reasonable blood pressure goal would be 123456 systolic. 3. Hyperlipidemia - this is followed by primary care. The patient says she discontinued her atorvastatin because she does not see the  benefit of taking it long term when she's already made it to 82 years old. 4. Hyponatremia - further monitoring per primary care. 5. Mild AS/mild TR - asymptomatic. Follow clinically.  Disposition: F/u in pharmD clinic in 10 days for blood pressure recheck.   Medication Adjustments/Labs and Tests Ordered: Current medicines are reviewed at length with the patient today.  Concerns regarding medicines are outlined above. Medication changes, Labs and Tests ordered today are summarized above and listed in the Patient Instructions accessible in Encounters.   Raechel Ache PA-C  07/09/2016 3:01 PM    [facility] [address], [place], [place]  [address] Phone: [phone]; Fax: [phone]

## 2016-07-09 NOTE — Patient Instructions (Signed)
Your physician has recommended you make the following change in your medication:  RESTART AMLODIPINE  5 MG  AT BEDTIME  Your physician recommends that you schedule a follow-up appointment in:  [place]

## 2016-07-11 NOTE — Addendum Note (Signed)
Addended by: Briant Cedar on: 07/11/2016 01:05 PM   Modules accepted: Orders

## 2016-07-17 NOTE — Progress Notes (Signed)
Patient ID: Denise Wagner                 DOB: 16-Feb-1924                      MRN: [id number]     HPI: Denise Wagner is a pleasant 82 y.o. female referred to HTN clinic by Melina Copa, PA. PMH is significant for HTN, HLD, and mild anemia. Her PCP started her on amlodipine but pt stopped taking it after 2 days because it made her feel nervous and she had a headache. She agreed to retry amlodipine at 2.5mg  daily and move her dosing to the evening at last visit 2 weeks ago. Pt presents today for follow up.  Pt reports tolerating her amlodipine 2.5mg  in the evening better than the 5mg  dose. She checks her BP at home 3 times a day and brings her BP log in today. She reports adherence with her other medications. BP readings range: 129/66 - 157/71, most readings [id number] systolic, rarely > [id number]. HR 60-70s. Denies dizziness or falls. She uses Advil rarely but as needed for headaches. [facility] and does [place] aerobics at her pool in the summer.  Of note, BP readings today differ by 15 points systolic between her left and right arm. BP in left arm: 144/58 - this is the arm pt checks her BP on at home and has consistent readings at goal. BP in right arm: 162/60 - exact reading pt had at last OV, pt thinks her BP was checked on this arm in clinic last time too.   Pt had a chest scan on 06/17/16 which showed calcification of the aorta. She has been previously intolerant to multiple statins. Most recently, her PCP Dr Quay Burow started her on Lipitor 5mg  daily. Pt reports that even at this dose, she had myalgias and couldn't get out of her chair to walk.  Current HTN meds: amlodipine 2.5mg  daily, metoprolol tartrate 25mg  BID, losartan-HCTZ 100-25mg  daily Previously tried: amlodipine 5mg  - felt nervous and had a headache BP goal: <150/66mmHg given advanced age  Family History: Arthritis in her mother; Breast cancer in her sister; Diabetes in her maternal grandmother; Heart disease in her brother and  cousin.   Social History: Denies tobacco, alcohol, and illicit drug use.  Diet: Does not add salt to food, avoids fried foods and rarely eats red meat.   Exercise: Has a pool and does water aerobics for an hour and a half in the summer. [facility].  Wt Readings from Last 3 Encounters:  07/09/16 154 lb (69.9 kg)  06/24/16 153 lb (69.4 kg)  06/17/16 152 lb 6.4 oz (69.1 kg)   BP Readings from Last 3 Encounters:  07/09/16 (!) 162/60  06/24/16 (!) 176/72  06/17/16 (!) 128/47   Pulse Readings from Last 3 Encounters:  07/09/16 66  06/24/16 (!) 59  06/17/16 60    Renal function: CrCl cannot be calculated (Patient's most recent lab result is older than the maximum 21 days allowed.).  Past Medical History:  Diagnosis Date  . Hyperlipidemia   . Hypertension   . Mild anemia   . Mild aortic stenosis   . Mild tricuspid regurgitation   . Osteopenia   . Vitamin D deficiency     Current Outpatient Prescriptions on File Prior to Visit  Medication Sig Dispense Refill  . aspirin 81 MG tablet Take 81 mg by mouth daily.      Marland Kitchen  CALCIUM-VITAMIN D PO Take 1 tablet by mouth daily.     . cetirizine (ZYRTEC ALLERGY) 10 MG tablet Take 10 mg by mouth daily as needed for allergies.     . Cholecalciferol (VITAMIN D3) 1000 UNITS CAPS Take 1,000 mg by mouth daily.      . fluticasone (FLONASE) 50 MCG/ACT nasal spray USE 1 SPRAY IN EACH NOSTRIL TWICE A DAY AS NEEDED FOR ALLERGIES OR RHINITIS. 48 g 1  . Ibuprofen (ADVIL PO) Take 1 tablet by mouth daily as needed (PAIN).    Marland Kitchen L-Lysine 500 MG TABS Take 500 mg by mouth daily.      Marland Kitchen losartan-hydrochlorothiazide (HYZAAR) 100-25 MG tablet TAKE 1 TABLET DAILY 90 tablet 1  . metoprolol tartrate (LOPRESSOR) 25 MG tablet Take 1 tablet (25 mg total) by mouth 2 (two) times daily. 180 tablet 3  . Multiple Vitamins-Calcium (VIACTIV MULTI-VITAMIN PO) Take 2 tablets by mouth daily.     . Omega-3 Fatty Acids (FISH OIL) 1000 MG CAPS Take 1,000 mg by mouth 2 (two)  times daily.     . vitamin C (ASCORBIC ACID) 500 MG tablet Take 500 mg by mouth daily.     . vitamin E (VITAMIN E) 400 UNIT capsule Take 400 Units by mouth daily.       No current facility-administered medications on file prior to visit.     Allergies  Allergen Reactions  . Doxycycline     REACTION: hives  . Metronidazole   . Penicillins   . Propoxyphene N-Acetaminophen   . Sulfonamide Derivatives   . Pravastatin     11/2010 Leg weakness      Assessment/Plan:  1. Hypertension - BP in patient's left arm is consistently at goal <150/56mmHg on clinic check and with repeat home BP checks. BP in right arm shows systolic 15 points higher. Pt did have some calcification of the aorta per recent chest scan; it is likely that she also has some peripheral plaque build up which may be causing the discrepancy in BP readings. Given patient's age and previous intolerance to statins including debilitating myalgias with Lipitor 5mg  that affected her ADLs, will not pursue further lipid lowering therapy. Patient prefers to continue her current medication regimen. She will start to check her BP in both her left and right arms. Advised pt to call clinic if she notices systolic BP consistently [id number], otherwise she will follow up with PCP as scheduled in March.   Raiden Haydu E. Trejan Buda, PharmD, CPP, [place] [id number], [place], [place] [address] Phone: [phone]; Fax: [phone] 07/18/2016 3:33 PM

## 2016-07-18 ENCOUNTER — Ambulatory Visit (INDEPENDENT_AMBULATORY_CARE_PROVIDER_SITE_OTHER): Payer: Medicare Other | Admitting: Pharmacist

## 2016-07-18 VITALS — BP 144/58 | HR 71

## 2016-07-18 DIAGNOSIS — I10 Essential (primary) hypertension: Secondary | ICD-10-CM

## 2016-07-18 MED ORDER — AMLODIPINE BESYLATE 2.5 MG PO TABS: 2.5000 mg | ORAL_TABLET | Freq: Every day | ORAL | 11 refills | Status: DC

## 2016-07-18 NOTE — Patient Instructions (Signed)
Continue taking your current medications.  I sent in a refill for amlodipine 2.5mg  to take daily.  Continue to check your blood pressure at home in each arm - the reading in your right arm was 15 points higher than your left.  Follow up with your primary care doctor as scheduled.

## 2016-08-26 DIAGNOSIS — H52203 Unspecified astigmatism, bilateral: Secondary | ICD-10-CM | POA: Diagnosis not present

## 2016-08-26 DIAGNOSIS — H1789 Other corneal scars and opacities: Secondary | ICD-10-CM | POA: Diagnosis not present

## 2016-09-22 ENCOUNTER — Other Ambulatory Visit: Payer: Self-pay | Admitting: Internal Medicine

## 2016-09-22 DIAGNOSIS — I10 Essential (primary) hypertension: Secondary | ICD-10-CM

## 2016-10-22 NOTE — Progress Notes (Signed)
Subjective:    Patient ID: Denise Wagner, female    DOB: 12/25/1923, 83 y.o.   MRN: [id number]  HPI Here for medicare wellness exam and follow up of her chronic medical problems.   I have personally reviewed and have noted 1.The patient's medical and social history 2.Their use of alcohol, tobacco or illicit drugs 3.Their current medications and supplements 4.The patient's functional ability including ADL's, fall risks, home safety risks and hearing or visual impairment. 5.Diet and physical activities 6.Evidence for depression or mood disorders 7.Care team reviewed   Her BP at home is 814-481 systollically,  Diastolic 85-63.  Nasal congestion:  She can breath through her nose, and sounds congested.  She is taking zyrtec at night.  She has tried flonase recently and it did not help. She wondered if she should try claritin or something different.  She can breath through her nose.  She denies sinus pain or fevers.     Are there smokers in your home (other than you)? No  Risk Factors Exercise: no regular exercise during winter, will start water aerobics in spring - fall Dietary issues discussed: well balanced, eats a little everything  Cardiac risk factors: advanced age, hypertension, hyperlipidemia    Depression Screen  Have you felt down, depressed or hopeless? No  Have you felt little interest or pleasure in doing things?  No  Activities of Daily Living In your present state of health, do you have any difficulty performing the following activities?:  Driving? No Managing money?  No Feeding yourself? No Getting from bed to chair? No Climbing a flight of stairs? No Preparing food and eating?: No Bathing or showering? No Getting dressed: No Getting to/using the toilet? No Moving around from place to place: No In the past year have you fallen or had a near fall?: No  Can still do everything, but it just  takes longer.     Are you sexually active?  No  Do you have more than one partner?  N/A  Hearing Difficulties: yes  Do you often ask people to speak up or repeat themselves? Yes, mild Do you experience ringing or noises in your ears? No Do you have difficulty understanding soft or whispered voices? yes Vision:              Any change in vision:  no             Up to date with eye exam: yes Memory:  Do you feel that you have a problem with memory? No  Do you often misplace items? No  Do you feel safe at home?  Yes  Cognitive Testing  Alert, Orientated? Yes  Normal Appearance? Yes  Recall of three objects?  Yes  Can perform simple calculations? Yes  Displays appropriate judgment? Yes  Can read the correct time from a watch face? Yes   Advanced Directives have been discussed with the patient? Yes     Medications and allergies reviewed with patient and updated if appropriate.  Patient Active Problem List   Diagnosis Date Noted  . Chest pain 06/17/2016  . Vitamin D deficiency 10/25/2007  . Hyperlipidemia 10/25/2007  . Essential hypertension 10/25/2007  . Osteopenia 08/18/2007    Current Outpatient Prescriptions on File Prior to Visit  Medication Sig Dispense Refill  . aspirin 81 MG tablet Take 81 mg by mouth daily.      Marland Kitchen CALCIUM-VITAMIN D PO Take 1 tablet by mouth daily.     Marland Kitchen  cetirizine (ZYRTEC ALLERGY) 10 MG tablet Take 10 mg by mouth daily as needed for allergies.     . Cholecalciferol (VITAMIN D3) 1000 UNITS CAPS Take 1,000 mg by mouth daily.      . fluticasone (FLONASE) 50 MCG/ACT nasal spray USE 1 SPRAY IN EACH NOSTRIL TWICE A DAY AS NEEDED FOR ALLERGIES OR RHINITIS. 48 g 1  . Ibuprofen (ADVIL PO) Take 1 tablet by mouth daily as needed (PAIN).    Marland Kitchen L-Lysine 500 MG TABS Take 500 mg by mouth daily.      Marland Kitchen losartan-hydrochlorothiazide (HYZAAR) 100-25 MG tablet TAKE 1 TABLET DAILY 90 tablet 1  . metoprolol tartrate (LOPRESSOR) 25 MG tablet TAKE 1 TABLET TWICE A DAY 180  tablet 0  . Multiple Vitamins-Calcium (VIACTIV MULTI-VITAMIN PO) Take 2 tablets by mouth daily.     . Omega-3 Fatty Acids (FISH OIL) 1000 MG CAPS Take 1,000 mg by mouth 2 (two) times daily.     . vitamin C (ASCORBIC ACID) 500 MG tablet Take 500 mg by mouth daily.     . vitamin E (VITAMIN E) 400 UNIT capsule Take 400 Units by mouth daily.      Marland Kitchen amLODipine (NORVASC) 2.5 MG tablet Take 1 tablet (2.5 mg total) by mouth daily. 30 tablet 11   No current facility-administered medications on file prior to visit.     Past Medical History:  Diagnosis Date  . Hyperlipidemia   . Hypertension   . Mild anemia   . Mild aortic stenosis   . Mild tricuspid regurgitation   . Osteopenia   . Vitamin D deficiency     Past Surgical History:  Procedure Laterality Date  . cataract surgery  2007   bilaterally  . gravida 0,para 0,mis 0, Dr.Gottsegen    . HEMORRHOID SURGERY    . no colonoscopy     "I never had a problem" ([place] reviewed)  . synvisc injections; r knee     Dr Lynann Bologna    Social History   Social History  . Marital status: Widowed    Spouse name: N/A  . Number of children: N/A  . Years of education: N/A   Occupational History  . retired Retired   Social History Main Topics  . Smoking status: Never Smoker  . Smokeless tobacco: Never Used  . Alcohol use Yes     Comment: rarely  . Drug use: No  . Sexual activity: Not on file   Other Topics Concern  . Not on file   Social History Narrative   No diet          Family History  Problem Relation Age of Onset  . Arthritis Mother   . Heart disease Brother     MI in 22's  . Diabetes Maternal Grandmother   . Heart disease Cousin     MI  . Breast cancer Sister   . Stroke Neg Hx     Review of Systems  Constitutional: Negative for appetite change, chills and fever.  HENT: Positive for congestion and hearing loss. Negative for postnasal drip, sinus pain and sinus pressure.   Eyes: Negative for visual disturbance.    Respiratory: Negative for choking, shortness of breath and wheezing.   Cardiovascular: Positive for leg swelling (chronic at baseline). Negative for chest pain and palpitations.  Gastrointestinal: Negative for abdominal pain, constipation and diarrhea.  Endocrine: Positive for cold intolerance.  Neurological: Negative for light-headedness and headaches.  Psychiatric/Behavioral: Negative for dysphoric mood. The patient is not nervous/anxious.  Objective:   Vitals:   10/23/16 0928  BP: (!) 150/64  Pulse: (!) 57  Temp: 97.8 F (36.6 C)   Filed Weights   10/23/16 0928  Weight: 152 lb (68.9 kg)   Body mass index is 25.29 kg/m.  Wt Readings from Last 3 Encounters:  10/23/16 152 lb (68.9 kg)  07/09/16 154 lb (69.9 kg)  06/24/16 153 lb (69.4 kg)     Physical Exam Constitutional: She appears well-developed and well-nourished. No distress.  HENT:  Head: Normocephalic and atraumatic.  Right Ear: External ear normal. Normal ear canal and TM Left Ear: External ear normal.  Normal ear canal and TM Mouth/Throat: Oropharynx is clear and moist.  Eyes: Conjunctivae and EOM are normal.  Neck: Neck supple. No tracheal deviation present. No thyromegaly present.  No carotid bruit  Cardiovascular: Normal rate, regular rhythm and normal heart sounds.   No murmur heard.  No edema. Pulmonary/Chest: Effort normal and breath sounds normal. No respiratory distress. She has no wheezes. She has no rales.  Breast: deferred Abdominal: Soft. She exhibits no distension. There is no tenderness.  Lymphadenopathy: She has no cervical adenopathy.  Skin: Skin is warm and dry. She is not diaphoretic.  Psychiatric: She has a normal mood and affect. Her behavior is normal.         Assessment & Plan:   Wellness Exam: Immunizations   Up to date  Colonoscopy  - no longer needed due to age 85 -  No longer needed due to age Dexa - she deferred - does not feel she needs it Eye exam  Up to  date  Hearing loss  - mild, does not bother her Memory concerns/difficulties  none Independent of ADLs  fully Stressed the importance of regular exercise   Patient received copy of preventative screening tests/immunizations recommended for the next 5-10 years.   See Problem List for Assessment and Plan of chronic medical problems.

## 2016-10-23 ENCOUNTER — Other Ambulatory Visit (INDEPENDENT_AMBULATORY_CARE_PROVIDER_SITE_OTHER): Payer: Medicare Other

## 2016-10-23 ENCOUNTER — Encounter: Payer: Self-pay | Admitting: Internal Medicine

## 2016-10-23 ENCOUNTER — Ambulatory Visit (INDEPENDENT_AMBULATORY_CARE_PROVIDER_SITE_OTHER): Payer: Medicare Other | Admitting: Internal Medicine

## 2016-10-23 VITALS — BP 150/64 | HR 57 | Temp 97.8°F | Ht 65.0 in | Wt 152.0 lb

## 2016-10-23 DIAGNOSIS — I10 Essential (primary) hypertension: Secondary | ICD-10-CM

## 2016-10-23 DIAGNOSIS — M8580 Other specified disorders of bone density and structure, unspecified site: Secondary | ICD-10-CM | POA: Diagnosis not present

## 2016-10-23 DIAGNOSIS — E785 Hyperlipidemia, unspecified: Secondary | ICD-10-CM | POA: Diagnosis not present

## 2016-10-23 DIAGNOSIS — Z0000 Encounter for general adult medical examination without abnormal findings: Secondary | ICD-10-CM | POA: Diagnosis not present

## 2016-10-23 LAB — LIPID PANEL
CHOLESTEROL: 223 mg/dL — AB (ref 0–200)
HDL: 50 mg/dL (ref >39.00)
LDL CALC: 150 mg/dL — AB (ref 0–99)
NonHDL: 173.15
TRIGLYCERIDES: 114 mg/dL (ref 0.0–149.0)
Total CHOL/HDL Ratio: 4
VLDL: 22.8 mg/dL (ref 0.0–40.0)

## 2016-10-23 LAB — CBC WITH DIFFERENTIAL/PLATELET
Basophils Absolute: 0 10*3/uL (ref 0.0–0.1)
Basophils Relative: 0.3 % (ref 0.0–3.0)
EOS PCT: 1.5 % (ref 0.0–5.0)
Eosinophils Absolute: 0.1 10*3/uL (ref 0.0–0.7)
HEMATOCRIT: 38.6 % (ref 36.0–46.0)
HEMOGLOBIN: 13.1 g/dL (ref 12.0–15.0)
LYMPHS PCT: 27.4 % (ref 12.0–46.0)
Lymphs Abs: 2.4 10*3/uL (ref 0.7–4.0)
MCHC: 33.9 g/dL (ref 30.0–36.0)
MCV: 93 fl (ref 78.0–100.0)
MONOS PCT: 8.5 % (ref 3.0–12.0)
Monocytes Absolute: 0.7 10*3/uL (ref 0.1–1.0)
Neutro Abs: 5.5 10*3/uL (ref 1.4–7.7)
Neutrophils Relative %: 62.3 % (ref 43.0–77.0)
Platelets: 247 10*3/uL (ref 150.0–400.0)
RBC: 4.15 Mil/uL (ref 3.87–5.11)
RDW: 13.4 % (ref 11.5–15.5)
WBC: 8.8 10*3/uL (ref 4.0–10.5)

## 2016-10-23 LAB — COMPREHENSIVE METABOLIC PANEL
ALBUMIN: 4 g/dL (ref 3.5–5.2)
ALT: 11 U/L (ref 0–35)
AST: 15 U/L (ref 0–37)
Alkaline Phosphatase: 45 U/L (ref 39–117)
BUN: 15 mg/dL (ref 6–23)
CALCIUM: 9.8 mg/dL (ref 8.4–10.5)
CHLORIDE: 100 meq/L (ref 96–112)
CO2: 30 mEq/L (ref 19–32)
Creatinine, Ser: 0.89 mg/dL (ref 0.40–1.20)
GFR: 63.27 mL/min (ref >60.00)
Glucose, Bld: 94 mg/dL (ref 70–99)
POTASSIUM: 4 meq/L (ref 3.5–5.1)
Sodium: 136 mEq/L (ref 135–145)
Total Bilirubin: 0.8 mg/dL (ref 0.2–1.2)
Total Protein: 6.6 g/dL (ref 6.0–8.3)

## 2016-10-23 LAB — TSH: TSH: 1.36 u[IU]/mL (ref 0.35–4.50)

## 2016-10-23 MED ORDER — LOSARTAN POTASSIUM-HCTZ 100-25 MG PO TABS: 1.0000 | ORAL_TABLET | Freq: Every day | ORAL | 3 refills | Status: DC

## 2016-10-23 MED ORDER — METOPROLOL TARTRATE 25 MG PO TABS: 25.0000 mg | ORAL_TABLET | Freq: Two times a day (BID) | ORAL | 3 refills | Status: DC

## 2016-10-23 MED ORDER — AMLODIPINE BESYLATE 2.5 MG PO TABS: 2.5000 mg | ORAL_TABLET | Freq: Every day | ORAL | 3 refills | Status: DC

## 2016-10-23 NOTE — Patient Instructions (Signed)
  Denise Wagner , Thank you for taking time to come for your Medicare Wellness Visit. I appreciate your ongoing commitment to your health goals. Please review the following plan we discussed and let me know if I can assist you in the future.   These are the goals we discussed: Goals    None      This is a list of the screening recommended for you and due dates:  Health Maintenance  Topic Date Due  . DEXA scan (bone density measurement)  01/07/2017*  . Mammogram  07/04/2017  . Tetanus Vaccine  07/14/2022  . Flu Shot  Completed  . Pneumonia vaccines  Completed  *Topic was postponed. The date shown is not the original due date.    Test(s) ordered today. Your results will be released to [facility] (or called to you) after review, usually within 72hours after test completion. If any changes need to be made, you will be notified at that same time.  All other Health Maintenance issues reviewed.   All recommended immunizations and age-appropriate screenings are up-to-date or discussed.  No immunizations administered today.   Medications reviewed and updated.  No changes recommended at this time.  Your prescription(s) have been submitted to your pharmacy. Please take as directed and contact our office if you believe you are having problem(s) with the medication(s).   Please followup in one year

## 2016-10-23 NOTE — Assessment & Plan Note (Signed)
Check lipid panel, cmp 

## 2016-10-23 NOTE — Assessment & Plan Note (Signed)
She deferred further dexa scan  Not interested in treatment

## 2016-10-23 NOTE — Progress Notes (Signed)
Pre visit review using our clinic review tool, if applicable. No additional management support is needed unless otherwise documented below in the visit note. 

## 2016-10-23 NOTE — Assessment & Plan Note (Addendum)
Average BP controlled No change in medications today Check labs today

## 2016-12-08 ENCOUNTER — Ambulatory Visit (INDEPENDENT_AMBULATORY_CARE_PROVIDER_SITE_OTHER): Payer: Medicare Other | Admitting: Internal Medicine

## 2016-12-08 ENCOUNTER — Encounter: Payer: Self-pay | Admitting: Internal Medicine

## 2016-12-08 VITALS — BP 150/62 | HR 64 | Temp 99.2°F | Resp 16 | Wt 153.0 lb

## 2016-12-08 DIAGNOSIS — J301 Allergic rhinitis due to pollen: Secondary | ICD-10-CM

## 2016-12-08 DIAGNOSIS — J0100 Acute maxillary sinusitis, unspecified: Secondary | ICD-10-CM | POA: Diagnosis not present

## 2016-12-08 DIAGNOSIS — J309 Allergic rhinitis, unspecified: Secondary | ICD-10-CM | POA: Insufficient documentation

## 2016-12-08 DIAGNOSIS — J019 Acute sinusitis, unspecified: Secondary | ICD-10-CM | POA: Insufficient documentation

## 2016-12-08 MED ORDER — AZITHROMYCIN 250 MG PO TABS: ORAL_TABLET | ORAL | 0 refills | Status: DC

## 2016-12-08 NOTE — Patient Instructions (Addendum)
  Take the antibiotic as prescribed.  Continue the other cold medications to help relieve your symptoms - nasal spray, flonase and mucinex.  Continue to take advil as needed.    Call if no improvement    Sample of xyzal given.

## 2016-12-08 NOTE — Assessment & Plan Note (Signed)
Likely has an infection on top of seasonal allergies Start zpak Switch zyrtec to xyzal - samples given - she does not feel like zyrtec is working Continue flonase, saline nasal spray, mucinex and advil for symptom relief  Call if no improvement

## 2016-12-08 NOTE — Assessment & Plan Note (Signed)
Taking zyrtec now and using flonase Trial of xyzal - samples given - stop zyrtec

## 2016-12-08 NOTE — Progress Notes (Signed)
Subjective:    Patient ID: Denise Wagner, female    DOB: December 26, 1923, 83 y.o.   MRN: [id number]  HPI She is here for an acute visit for cold symptoms.   Her symptoms started 2-3 weeks ago.  She has allergies, but for the past few weeks she has felt like her symtpoms were much worse than allergies.  She think she has a sinus infection.  She is experiencing nasal congestion and has seen some discolored mucus, PND, sinus pain/pressure and mild sore throat.  She was able to get come mucus out today,but her sinuses have felt clogged.  She has had a mild cough, some headaches and lightheadedness.  She has had sinus infections in the past and this feels like one.  She has not had one for years.   She has been zyrtec nightly for a while.  She has also tried mucinex, flonase, advil taking with some improvement in symptoms.   Medications and allergies reviewed with patient and updated if appropriate.  Patient Active Problem List   Diagnosis Date Noted  . Chest pain 06/17/2016  . Vitamin D deficiency 10/25/2007  . Hyperlipidemia 10/25/2007  . Essential hypertension 10/25/2007  . Osteopenia 08/18/2007    Current Outpatient Prescriptions on File Prior to Visit  Medication Sig Dispense Refill  . amLODipine (NORVASC) 2.5 MG tablet Take 1 tablet (2.5 mg total) by mouth daily. 90 tablet 3  . aspirin 81 MG tablet Take 81 mg by mouth daily.      Marland Kitchen CALCIUM-VITAMIN D PO Take 1 tablet by mouth daily.     . cetirizine (ZYRTEC ALLERGY) 10 MG tablet Take 10 mg by mouth daily as needed for allergies.     . Cholecalciferol (VITAMIN D3) 1000 UNITS CAPS Take 1,000 mg by mouth daily.      . fluticasone (FLONASE) 50 MCG/ACT nasal spray USE 1 SPRAY IN EACH NOSTRIL TWICE A DAY AS NEEDED FOR ALLERGIES OR RHINITIS. 48 g 1  . Ibuprofen (ADVIL PO) Take 1 tablet by mouth daily as needed (PAIN).    Marland Kitchen L-Lysine 500 MG TABS Take 500 mg by mouth daily.      Marland Kitchen losartan-hydrochlorothiazide (HYZAAR) 100-25 MG tablet  Take 1 tablet by mouth daily. 90 tablet 3  . metoprolol tartrate (LOPRESSOR) 25 MG tablet Take 1 tablet (25 mg total) by mouth 2 (two) times daily. 180 tablet 3  . Multiple Vitamins-Calcium (VIACTIV MULTI-VITAMIN PO) Take 2 tablets by mouth daily.     . Omega-3 Fatty Acids (FISH OIL) 1000 MG CAPS Take 1,000 mg by mouth 2 (two) times daily.     . vitamin C (ASCORBIC ACID) 500 MG tablet Take 500 mg by mouth daily.     . vitamin E (VITAMIN E) 400 UNIT capsule Take 400 Units by mouth daily.       No current facility-administered medications on file prior to visit.     Past Medical History:  Diagnosis Date  . Hyperlipidemia   . Hypertension   . Mild anemia   . Mild aortic stenosis   . Mild tricuspid regurgitation   . Osteopenia   . Vitamin D deficiency     Past Surgical History:  Procedure Laterality Date  . cataract surgery  2007   bilaterally  . gravida 0,para 0,mis 0, Dr.Gottsegen    . HEMORRHOID SURGERY    . no colonoscopy     "I never had a problem" ([place] reviewed)  . synvisc injections; r knee  Dr Lynann Bologna    Social History   Social History  . Marital status: Widowed    Spouse name: N/A  . Number of children: N/A  . Years of education: N/A   Occupational History  . retired Retired   Social History Main Topics  . Smoking status: Never Smoker  . Smokeless tobacco: Never Used  . Alcohol use Yes     Comment: rarely  . Drug use: No  . Sexual activity: Not on file   Other Topics Concern  . Not on file   Social History Narrative   No diet          Family History  Problem Relation Age of Onset  . Arthritis Mother   . Heart disease Brother        MI in 36's  . Diabetes Maternal Grandmother   . Heart disease Cousin        MI  . Breast cancer Sister   . Stroke Neg Hx     Review of Systems  Constitutional: Negative for fever.  HENT: Positive for congestion (some discoloration), postnasal drip, sinus pain, sinus pressure and sore throat (minimal).  Negative for ear pain.   Respiratory: Positive for cough (minimal). Negative for shortness of breath and wheezing.   Neurological: Positive for light-headedness and headaches.       Objective:   Vitals:   12/08/16 1104  BP: (!) 150/62  Pulse: 64  Resp: 16  Temp: 99.2 F (37.3 C)   Filed Weights   12/08/16 1104  Weight: 153 lb (69.4 kg)   Body mass index is 25.46 kg/m.  Wt Readings from Last 3 Encounters:  12/08/16 153 lb (69.4 kg)  10/23/16 152 lb (68.9 kg)  07/09/16 154 lb (69.9 kg)     Physical Exam GENERAL APPEARANCE: Appears stated age, well appearing, NAD EYES: conjunctiva clear, no icterus HEENT: bilateral tympanic membranes and ear canals normal, maxillary sinus tenderness, oropharynx with mild erythema, no thyromegaly, trachea midline, no cervical or supraclavicular lymphadenopathy LUNGS: Clear to auscultation without wheeze or crackles, unlabored breathing, good air entry bilaterally HEART: Normal S1,S2 without murmurs EXTREMITIES: Without clubbing, cyanosis, or edema       Assessment & Plan:   See Problem List for Assessment and Plan of chronic medical problems.

## 2016-12-18 DIAGNOSIS — M1711 Unilateral primary osteoarthritis, right knee: Secondary | ICD-10-CM | POA: Diagnosis not present

## 2016-12-25 DIAGNOSIS — M1711 Unilateral primary osteoarthritis, right knee: Secondary | ICD-10-CM | POA: Diagnosis not present

## 2016-12-31 DIAGNOSIS — M1711 Unilateral primary osteoarthritis, right knee: Secondary | ICD-10-CM | POA: Diagnosis not present

## 2017-01-22 ENCOUNTER — Telehealth: Payer: Self-pay | Admitting: Internal Medicine

## 2017-01-22 NOTE — Telephone Encounter (Signed)
Pt called and said that she liked the sample of the xyzal that she was give.  She would like to know if Dr burns could called script in for her for this med?    The [facility] on file?

## 2017-01-23 MED ORDER — LEVOCETIRIZINE DIHYDROCHLORIDE 5 MG PO TABS: 5.0000 mg | ORAL_TABLET | Freq: Every evening | ORAL | 1 refills | Status: DC

## 2017-01-23 NOTE — Telephone Encounter (Signed)
It is an over the counter medication.

## 2017-01-23 NOTE — Telephone Encounter (Signed)
SPoke with pt, she is requesting that a generic rx be sent to the pharmacy. Per MD okay to send to pharmacy.

## 2017-02-06 DIAGNOSIS — L821 Other seborrheic keratosis: Secondary | ICD-10-CM | POA: Diagnosis not present

## 2017-02-06 DIAGNOSIS — L249 Irritant contact dermatitis, unspecified cause: Secondary | ICD-10-CM | POA: Diagnosis not present

## 2017-02-24 ENCOUNTER — Ambulatory Visit (INDEPENDENT_AMBULATORY_CARE_PROVIDER_SITE_OTHER): Payer: Medicare Other | Admitting: Family Medicine

## 2017-02-24 VITALS — BP 148/62 | HR 58 | Temp 98.3°F | Ht 65.0 in | Wt 153.0 lb

## 2017-02-24 DIAGNOSIS — R059 Cough, unspecified: Secondary | ICD-10-CM | POA: Insufficient documentation

## 2017-02-24 DIAGNOSIS — R05 Cough: Secondary | ICD-10-CM | POA: Diagnosis not present

## 2017-02-24 MED ORDER — BENZONATATE 100 MG PO CAPS: 100.0000 mg | ORAL_CAPSULE | Freq: Two times a day (BID) | ORAL | 0 refills | Status: DC | PRN

## 2017-02-24 NOTE — Patient Instructions (Signed)
Thank you for coming in,   You can try things such as zyrtec-D or allegra-D which is an antihistamine and decongestant. Only use this for 5 days.   You can try afrin which will help with nasal congestion but use for only three days.   You can also try using a netti pot on a regular occasion.  Please call back if her symptoms do not improve.   Please feel free to call with any questions or concerns at any time, at [phone]. --Dr. Raeford Razor

## 2017-02-24 NOTE — Progress Notes (Signed)
Denise Wagner - 83 y.o. female MRN [id number]  Date of birth: June 26, 1924  SUBJECTIVE:  Including CC & ROS.  Chief Complaint  Patient presents with  . Cough    2-3 weeks   Denise Wagner a 83 year old female that is presenting with a cough. She reports it is been occurring for about 2-3 weeks. She is at the beach recently and feels it may be exacerbated by that. She has tried some over-the-counter children's Mucinex with some improvement. She denies any fevers or chills. She takes cough syrup at night which seems to help. Her coughing is worse after she talks with someone for a while. She is not having any fevers or chills. She denies any new medications.     Review of Systems  Constitutional: Negative for fever.  HENT: Negative for sore throat.   Respiratory: Positive for cough. Negative for shortness of breath.    otherwise negative   HISTORY: Past Medical, Surgical, Social, and Family History Reviewed & Updated per EMR.   Pertinent Historical Findings include:  Past Medical History:  Diagnosis Date  . Hyperlipidemia   . Hypertension   . Mild anemia   . Mild aortic stenosis   . Mild tricuspid regurgitation   . Osteopenia   . Vitamin D deficiency     Past Surgical History:  Procedure Laterality Date  . cataract surgery  2007   bilaterally  . gravida 0,para 0,mis 0, Dr.Gottsegen    . HEMORRHOID SURGERY    . no colonoscopy     "I never had a problem" ([place] reviewed)  . synvisc injections; r knee     Dr Lynann Bologna    Allergies  Allergen Reactions  . Doxycycline     REACTION: hives  . Metronidazole   . Penicillins   . Propoxyphene N-Acetaminophen   . Sulfonamide Derivatives   . Pravastatin     11/2010 Leg weakness     Family History  Problem Relation Age of Onset  . Arthritis Mother   . Heart disease Brother        MI in 82's  . Diabetes Maternal Grandmother   . Heart disease Cousin        MI  . Breast cancer Sister   . Stroke Neg Hx      Social History     Social History  . Marital status: Widowed    Spouse name: N/A  . Number of children: N/A  . Years of education: N/A   Occupational History  . retired Retired   Social History Main Topics  . Smoking status: Never Smoker  . Smokeless tobacco: Never Used  . Alcohol use Yes     Comment: rarely  . Drug use: No  . Sexual activity: Not on file   Other Topics Concern  . Not on file   Social History Narrative   No diet           PHYSICAL EXAM:  VS: BP (!) 148/62 (BP Location: Left Arm, Patient Position: Sitting, Cuff Size: Normal)   Pulse (!) 58   Temp 98.3 F (36.8 C) (Oral)   Ht 5\' 5"  (1.651 m)   Wt 153 lb (69.4 kg)   SpO2 98%   BMI 25.46 kg/m  Physical Exam  Constitutional: She is oriented to person, place, and time. She appears well-developed and well-nourished.  HENT:  Head: Normocephalic and atraumatic.  Nose: Nose normal.  Mouth/Throat: Oropharynx is clear and moist.  Tympanic membranes clear and intact bilaterally  Eyes:  Conjunctivae and EOM are normal.  Neck: Normal range of motion. Neck supple.  Cardiovascular: Regular rhythm and normal heart sounds.   Pulmonary/Chest: Effort normal and breath sounds normal. She has no rales.  Abdominal: Soft. Bowel sounds are normal.  Musculoskeletal: Normal range of motion. She exhibits no edema.  Lymphadenopathy:    She has no cervical adenopathy.  Neurological: She is alert and oriented to person, place, and time.  Skin: Skin is warm. No rash noted.  Psychiatric: She has a normal mood and affect. Her behavior is normal.       ASSESSMENT & PLAN:   Cough Seems to be related to a viral infection. Her lungs sound clear today. - Tessalon Perles sent - She can try some over-the-counter medications. - If she does not have any improvement to consider a chest x-ray.

## 2017-02-24 NOTE — Assessment & Plan Note (Signed)
Seems to be related to a viral infection. Her lungs sound clear today. - Tessalon Perles sent - She can try some over-the-counter medications. - If she does not have any improvement to consider a chest x-ray.

## 2017-03-06 DIAGNOSIS — L309 Dermatitis, unspecified: Secondary | ICD-10-CM | POA: Diagnosis not present

## 2017-03-06 DIAGNOSIS — L570 Actinic keratosis: Secondary | ICD-10-CM | POA: Diagnosis not present

## 2017-03-06 DIAGNOSIS — L821 Other seborrheic keratosis: Secondary | ICD-10-CM | POA: Diagnosis not present

## 2017-03-13 ENCOUNTER — Telehealth: Payer: Self-pay | Admitting: Internal Medicine

## 2017-03-13 MED ORDER — BENZONATATE 100 MG PO CAPS: 100.0000 mg | ORAL_CAPSULE | Freq: Two times a day (BID) | ORAL | 0 refills | Status: DC | PRN

## 2017-03-13 NOTE — Telephone Encounter (Signed)
Pt called in and would like a refill on her benzonatate (TESSALON) 100 MG capsule [[id number]]   She said that her cough has gotten much better with these and is almost gone

## 2017-03-13 NOTE — Telephone Encounter (Signed)
sent 

## 2017-05-07 ENCOUNTER — Ambulatory Visit (INDEPENDENT_AMBULATORY_CARE_PROVIDER_SITE_OTHER): Payer: Medicare Other | Admitting: General Practice

## 2017-05-07 ENCOUNTER — Ambulatory Visit: Payer: Medicare Other

## 2017-05-07 DIAGNOSIS — Z23 Encounter for immunization: Secondary | ICD-10-CM | POA: Diagnosis not present

## 2017-05-25 DIAGNOSIS — H5712 Ocular pain, left eye: Secondary | ICD-10-CM | POA: Diagnosis not present

## 2017-06-08 ENCOUNTER — Other Ambulatory Visit: Payer: Self-pay | Admitting: Internal Medicine

## 2017-06-22 IMAGING — CR DG CHEST 2V
2 series · 2 of 2 positions shown · non-contrast
Comparison: 03/20/2015

CLINICAL DATA: Mid chest pain tonight.

EXAM:
CHEST  2 VIEW

[chest pa]
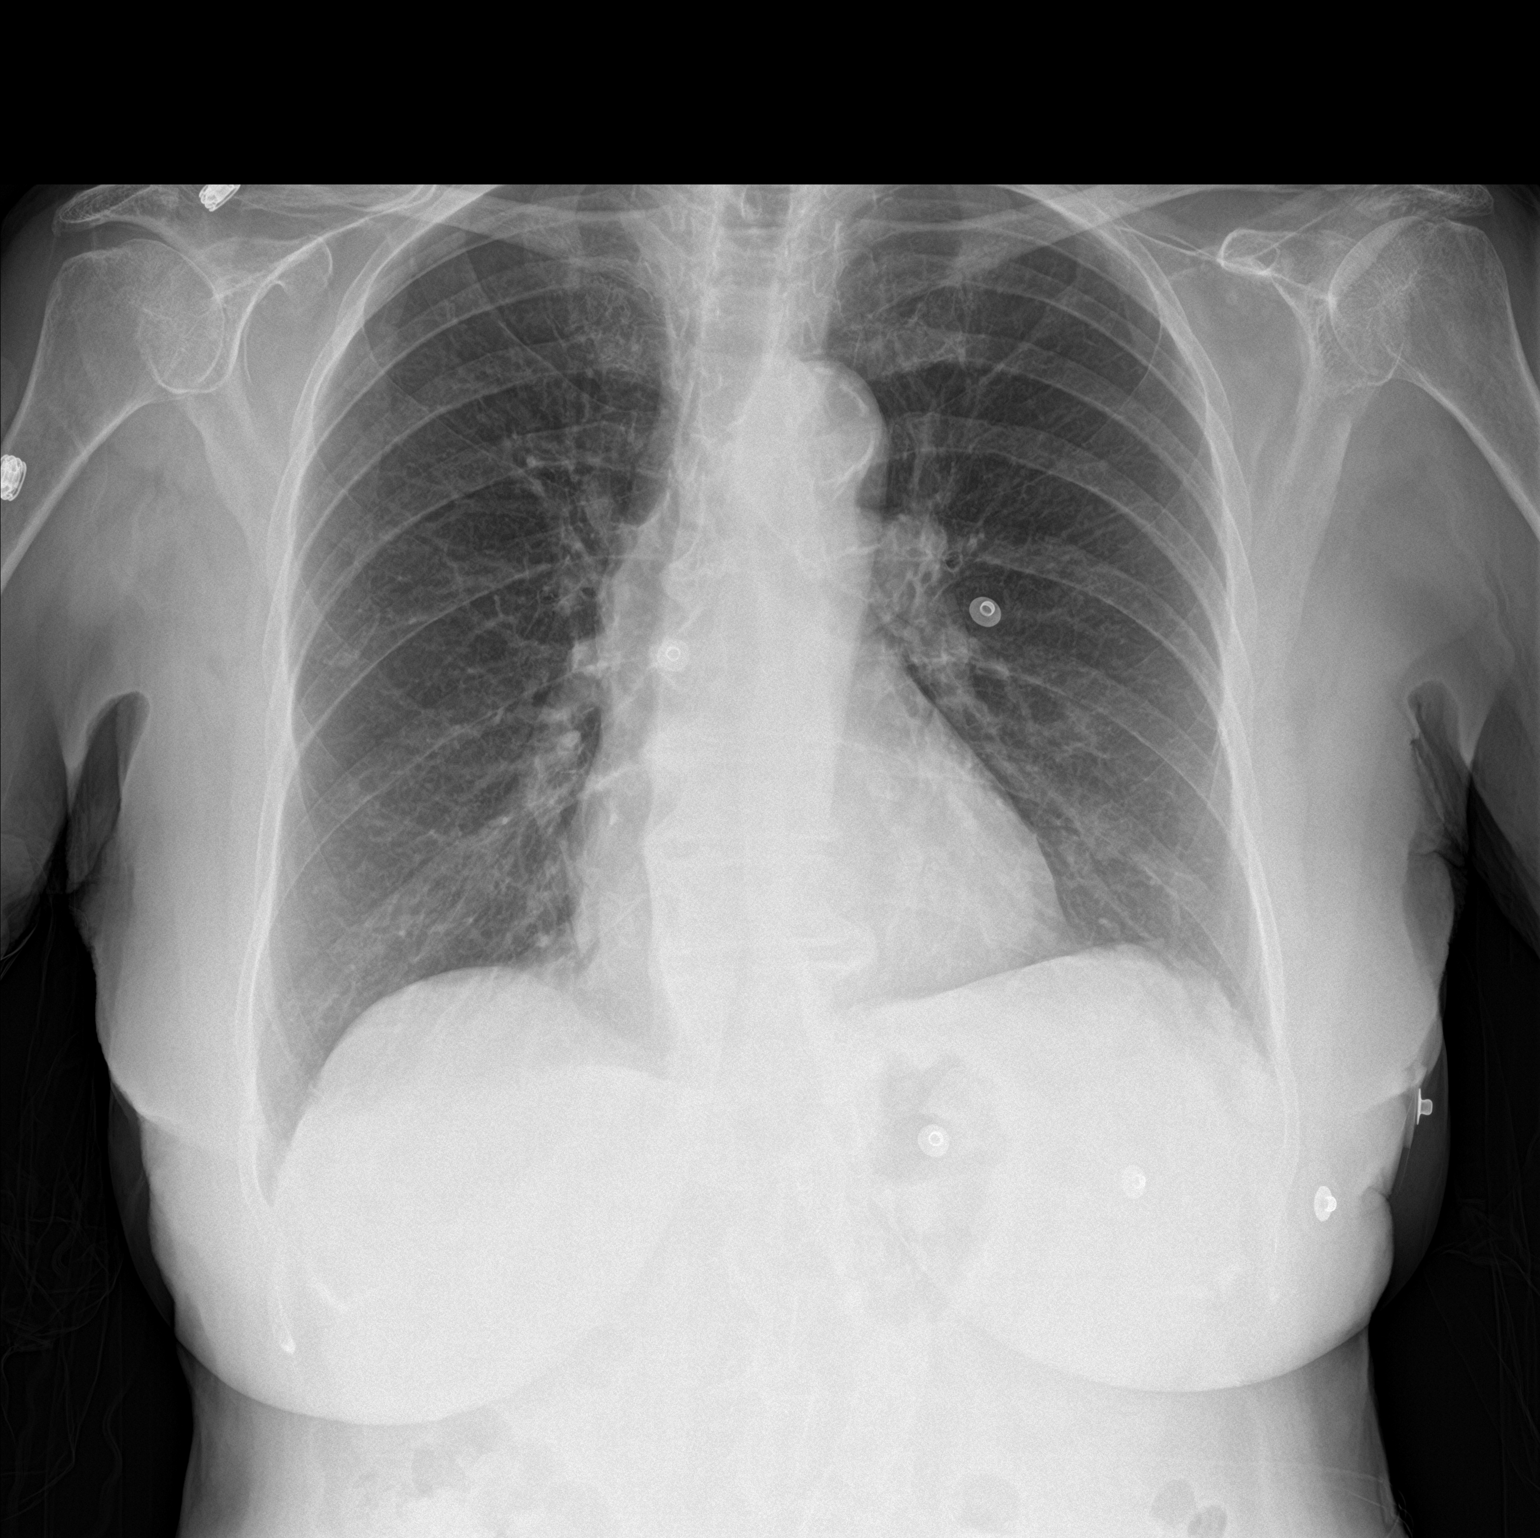

[chest lat]
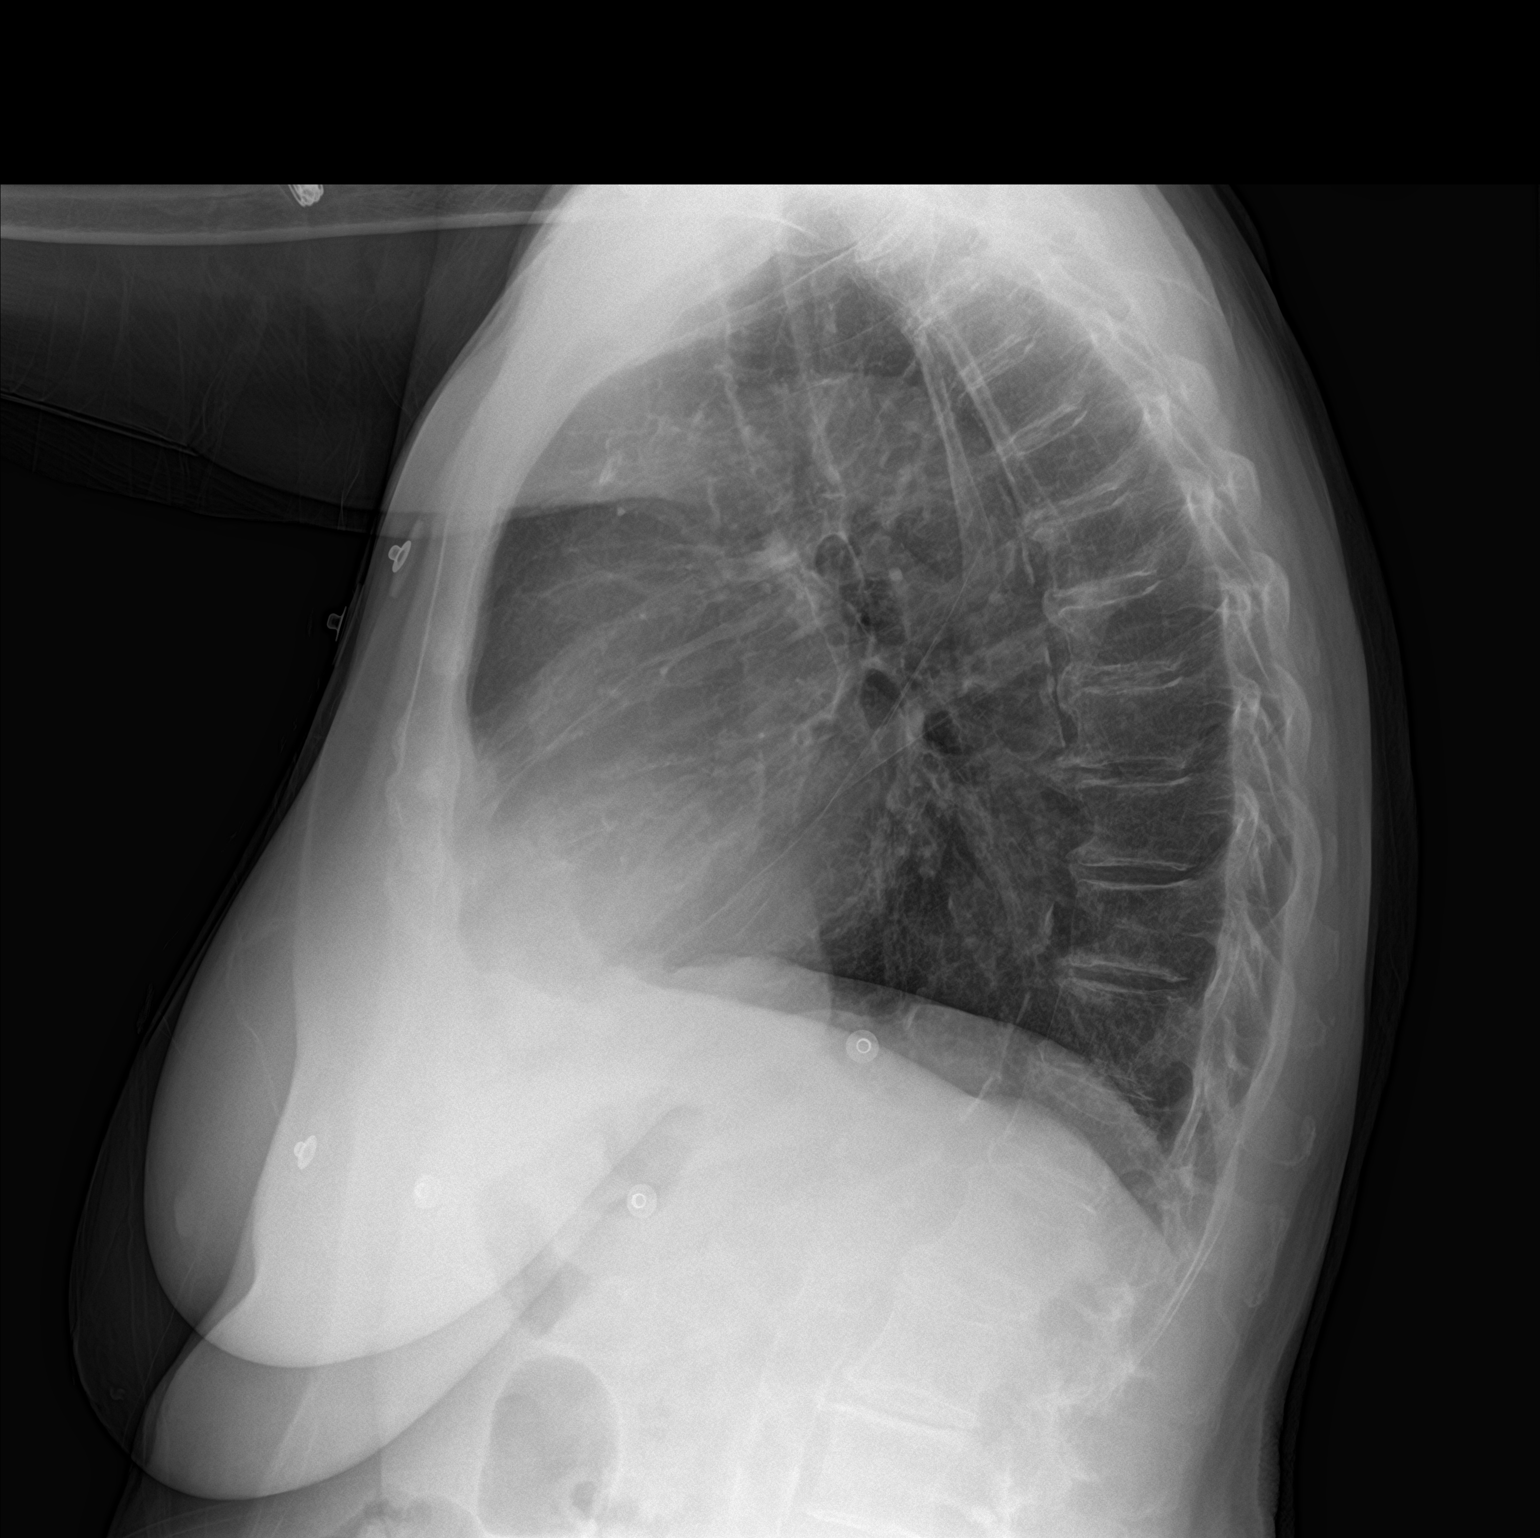

[2 of 2 positions shown; findings below may reference images not displayed]

FINDINGS: Normal heart size and pulmonary vascularity. Emphysematous changes
in the lungs. Slight fibrosis in the lung bases. No focal airspace
disease or consolidation in the lungs. No blunting of costophrenic
angles. No pneumothorax. Calcification of the aorta. Degenerative
changes in the spine.
IMPRESSION: Emphysematous changes in the lungs. No evidence of active pulmonary
disease.

## 2017-08-26 DIAGNOSIS — Z961 Presence of intraocular lens: Secondary | ICD-10-CM | POA: Diagnosis not present

## 2017-08-26 DIAGNOSIS — H52203 Unspecified astigmatism, bilateral: Secondary | ICD-10-CM | POA: Diagnosis not present

## 2017-10-07 ENCOUNTER — Other Ambulatory Visit: Payer: Self-pay | Admitting: Internal Medicine

## 2017-10-19 ENCOUNTER — Other Ambulatory Visit: Payer: Self-pay | Admitting: Internal Medicine

## 2017-10-25 NOTE — Progress Notes (Signed)
Subjective:    Patient ID: Denise Wagner, female    DOB: 1924-06-25, 84 y.o.   MRN: [id number]  HPI The patient is here for follow up.  Hypertension: She is taking her medication daily. She is compliant with a low sodium diet.  She denies chest pain, palpitations, edema, shortness of breath and regular headaches. She is not exercising regularly - tries to do some walking and does water exercises in the summer.  She does monitor her blood pressure at home - 109/59 - 142/69.    Leg edema;  Her legs and feet swell during the day.  The swelling goes away overnight.    Osteopenia, vitamin d def:    Hyperlipidemia: She is not on any medication. She did not tolerate pravastatin.  She is compliant with a low fat/cholesterol diet. She is exercising regularly. She denies myalgias.     Medications and allergies reviewed with patient and updated if appropriate.  Patient Active Problem List   Diagnosis Date Noted  . Allergic rhinitis 12/08/2016  . Chest pain 06/17/2016  . Vitamin D deficiency 10/25/2007  . Hyperlipidemia 10/25/2007  . Essential hypertension 10/25/2007  . Osteopenia 08/18/2007    Current Outpatient Medications on File Prior to Visit  Medication Sig Dispense Refill  . amLODipine (NORVASC) 2.5 MG tablet TAKE 1 TABLET DAILY 90 tablet 0  . aspirin 81 MG tablet Take 81 mg by mouth daily.      Marland Kitchen CALCIUM-VITAMIN D PO Take 1 tablet by mouth daily.     . cetirizine (ZYRTEC ALLERGY) 10 MG tablet Take 10 mg by mouth daily as needed for allergies.     . Cholecalciferol (VITAMIN D3) 1000 UNITS CAPS Take 1,000 mg by mouth daily.      . fluticasone (FLONASE) 50 MCG/ACT nasal spray USE 1 SPRAY IN EACH NOSTRIL TWICE A DAY AS NEEDED FOR ALLERGIES OR RHINITIS. 48 g 1  . Ibuprofen (ADVIL PO) Take 1 tablet by mouth daily as needed (PAIN).    Marland Kitchen L-Lysine 500 MG TABS Take 500 mg by mouth daily.      Marland Kitchen levocetirizine (XYZAL) 5 MG tablet Take 1 tablet (5 mg total) by mouth every evening. 90  tablet 1  . losartan-hydrochlorothiazide (HYZAAR) 100-25 MG tablet TAKE 1 TABLET DAILY 90 tablet 0  . metoprolol tartrate (LOPRESSOR) 25 MG tablet Take 1 tablet (25 mg total) by mouth 2 (two) times daily. 180 tablet 3  . Multiple Vitamins-Calcium (VIACTIV MULTI-VITAMIN PO) Take 2 tablets by mouth daily.     . Omega-3 Fatty Acids (FISH OIL) 1000 MG CAPS Take 1,000 mg by mouth 2 (two) times daily.     . vitamin C (ASCORBIC ACID) 500 MG tablet Take 500 mg by mouth daily.     . vitamin E (VITAMIN E) 400 UNIT capsule Take 400 Units by mouth daily.       No current facility-administered medications on file prior to visit.     Past Medical History:  Diagnosis Date  . Hyperlipidemia   . Hypertension   . Mild anemia   . Mild aortic stenosis   . Mild tricuspid regurgitation   . Osteopenia   . Vitamin D deficiency     Past Surgical History:  Procedure Laterality Date  . cataract surgery  2007   bilaterally  . gravida 0,para 0,mis 0, Dr.Gottsegen    . HEMORRHOID SURGERY    . no colonoscopy     "I never had a problem" ([place] reviewed)  .  synvisc injections; r knee     Dr Lynann Bologna    Social History   Socioeconomic History  . Marital status: Widowed    Spouse name: Not on file  . Number of children: Not on file  . Years of education: Not on file  . Highest education level: Not on file  Occupational History  . Occupation: retired    Fish farm manager: RETIRED  Social Needs  . Financial resource strain: Not on file  . Food insecurity:    Worry: Not on file    Inability: Not on file  . Transportation needs:    Medical: Not on file    Non-medical: Not on file  Tobacco Use  . Smoking status: Never Smoker  . Smokeless tobacco: Never Used  Substance and Sexual Activity  . Alcohol use: Yes    Comment: rarely  . Drug use: No  . Sexual activity: Not on file  Lifestyle  . Physical activity:    Days per week: Not on file    Minutes per session: Not on file  . Stress: Not on file    Relationships  . Social connections:    Talks on phone: Not on file    Gets together: Not on file    Attends religious service: Not on file    Active member of club or organization: Not on file    Attends meetings of clubs or organizations: Not on file    Relationship status: Not on file  Other Topics Concern  . Not on file  Social History Narrative   No diet          Family History  Problem Relation Age of Onset  . Arthritis Mother   . Heart disease Brother        MI in 23's  . Diabetes Maternal Grandmother   . Heart disease Cousin        MI  . Breast cancer Sister   . Stroke Neg Hx     Review of Systems  Constitutional: Negative for chills, fatigue and fever.  HENT: Positive for congestion (in morning only).   Respiratory: Positive for cough (chronic - allergies). Negative for shortness of breath and wheezing.   Cardiovascular: Positive for leg swelling (goes away overnight). Negative for chest pain and palpitations.  Gastrointestinal: Negative for abdominal pain.       Occ gerd  Endocrine: Positive for cold intolerance.  Neurological: Negative for light-headedness and headaches.       Objective:   Vitals:   10/27/17 0906  BP: (!) 146/66  Pulse: 78  Resp: 16  Temp: 97.8 F (36.6 C)   BP Readings from Last 3 Encounters:  10/27/17 (!) 146/66  02/24/17 (!) 148/62  12/08/16 (!) 150/62   Wt Readings from Last 3 Encounters:  10/27/17 152 lb (68.9 kg)  02/24/17 153 lb (69.4 kg)  12/08/16 153 lb (69.4 kg)   Body mass index is 25.29 kg/m.   Physical Exam    Constitutional: Appears well-developed and well-nourished. No distress.  HENT:  Head: Normocephalic and atraumatic.  Neck: Neck supple. No tracheal deviation present. No thyromegaly present.  No cervical lymphadenopathy Cardiovascular: Normal rate, regular rhythm and normal heart sounds.   No murmur heard. No carotid bruit .  Mild b/l LE edema Pulmonary/Chest: Effort normal and breath sounds  normal. No respiratory distress. No has no wheezes. No rales.  Skin: Skin is warm and dry. Not diaphoretic.  Psychiatric: Normal mood and affect. Behavior is normal.  Assessment & Plan:    See Problem List for Assessment and Plan of chronic medical problems.

## 2017-10-25 NOTE — Patient Instructions (Addendum)
  Test(s) ordered today. Your results will be released to [facility] (or called to you) after review, usually within 72hours after test completion. If any changes need to be made, you will be notified at that same time.   Medications reviewed and updated.  No changes recommended at this time.    Please followup in one year with me, wellness visit in 6 months

## 2017-10-27 ENCOUNTER — Encounter: Payer: Self-pay | Admitting: Internal Medicine

## 2017-10-27 ENCOUNTER — Ambulatory Visit (INDEPENDENT_AMBULATORY_CARE_PROVIDER_SITE_OTHER): Payer: Medicare Other | Admitting: Internal Medicine

## 2017-10-27 ENCOUNTER — Other Ambulatory Visit (INDEPENDENT_AMBULATORY_CARE_PROVIDER_SITE_OTHER): Payer: Medicare Other

## 2017-10-27 VITALS — BP 146/66 | HR 78 | Temp 97.8°F | Resp 16 | Wt 152.0 lb

## 2017-10-27 DIAGNOSIS — E785 Hyperlipidemia, unspecified: Secondary | ICD-10-CM

## 2017-10-27 DIAGNOSIS — I10 Essential (primary) hypertension: Secondary | ICD-10-CM

## 2017-10-27 DIAGNOSIS — E559 Vitamin D deficiency, unspecified: Secondary | ICD-10-CM

## 2017-10-27 LAB — COMPREHENSIVE METABOLIC PANEL
ALBUMIN: 3.7 g/dL (ref 3.5–5.2)
ALK PHOS: 41 U/L (ref 39–117)
ALT: 12 U/L (ref 0–35)
AST: 17 U/L (ref 0–37)
BUN: 16 mg/dL (ref 6–23)
CALCIUM: 9.7 mg/dL (ref 8.4–10.5)
CO2: 29 mEq/L (ref 19–32)
CREATININE: 0.82 mg/dL (ref 0.40–1.20)
Chloride: 99 mEq/L (ref 96–112)
GFR: 69.38 mL/min (ref >60.00)
Glucose, Bld: 108 mg/dL — ABNORMAL HIGH (ref 70–99)
POTASSIUM: 4.1 meq/L (ref 3.5–5.1)
SODIUM: 133 meq/L — AB (ref 135–145)
TOTAL PROTEIN: 6.5 g/dL (ref 6.0–8.3)
Total Bilirubin: 0.8 mg/dL (ref 0.2–1.2)

## 2017-10-27 LAB — CBC WITH DIFFERENTIAL/PLATELET
BASOS ABS: 0 10*3/uL (ref 0.0–0.1)
Basophils Relative: 0.6 % (ref 0.0–3.0)
EOS ABS: 0.2 10*3/uL (ref 0.0–0.7)
Eosinophils Relative: 2.5 % (ref 0.0–5.0)
HCT: 38.3 % (ref 36.0–46.0)
HEMOGLOBIN: 13.2 g/dL (ref 12.0–15.0)
LYMPHS PCT: 32.6 % (ref 12.0–46.0)
Lymphs Abs: 2.5 10*3/uL (ref 0.7–4.0)
MCHC: 34.5 g/dL (ref 30.0–36.0)
MCV: 92.8 fl (ref 78.0–100.0)
Monocytes Absolute: 0.7 10*3/uL (ref 0.1–1.0)
Monocytes Relative: 9 % (ref 3.0–12.0)
Neutro Abs: 4.3 10*3/uL (ref 1.4–7.7)
Neutrophils Relative %: 55.3 % (ref 43.0–77.0)
Platelets: 229 10*3/uL (ref 150.0–400.0)
RBC: 4.12 Mil/uL (ref 3.87–5.11)
RDW: 13.4 % (ref 11.5–15.5)
WBC: 7.8 10*3/uL (ref 4.0–10.5)

## 2017-10-27 LAB — TSH: TSH: 1.62 u[IU]/mL (ref 0.35–4.50)

## 2017-10-27 NOTE — Assessment & Plan Note (Addendum)
Does not want to take any medication - did not tolerate statins Will hold off on checking lipid panel Regular exercise and healthy diet encouraged

## 2017-10-27 NOTE — Assessment & Plan Note (Addendum)
BP well controlled Current regimen effective and well tolerated Continue current medications at current doses Cmp, tsh, cbc 

## 2017-10-27 NOTE — Assessment & Plan Note (Signed)
Taking vitamin d daily Continue vitamin d daily

## 2017-11-17 ENCOUNTER — Other Ambulatory Visit: Payer: Self-pay | Admitting: Internal Medicine

## 2017-11-17 DIAGNOSIS — I10 Essential (primary) hypertension: Secondary | ICD-10-CM

## 2017-12-10 DIAGNOSIS — M1711 Unilateral primary osteoarthritis, right knee: Secondary | ICD-10-CM | POA: Diagnosis not present

## 2017-12-17 DIAGNOSIS — M1711 Unilateral primary osteoarthritis, right knee: Secondary | ICD-10-CM | POA: Diagnosis not present

## 2017-12-24 DIAGNOSIS — M1711 Unilateral primary osteoarthritis, right knee: Secondary | ICD-10-CM | POA: Diagnosis not present

## 2018-01-05 ENCOUNTER — Other Ambulatory Visit: Payer: Self-pay | Admitting: Internal Medicine

## 2018-01-17 ENCOUNTER — Other Ambulatory Visit: Payer: Self-pay | Admitting: Internal Medicine

## 2018-04-28 NOTE — Progress Notes (Addendum)
Subjective:   Denise Wagner is a 84 y.o. female who presents for Medicare Annual (Subsequent) preventive examination.  Review of Systems:  No ROS.  Medicare Wellness Visit. Additional risk factors are reflected in the social history.  Cardiac Risk Factors include: advanced age (>28men, >34 women);dyslipidemia;hypertension Sleep patterns: feels rested on waking, gets up 1-2 times nightly to void and sleeps 7-8 hours nightly.    Home Safety/Smoke Alarms: Feels safe in home. Smoke alarms in place.  Living environment; residence and Firearm Safety: 1-story house/ trailer, no firearms. Lives alone, no needs for DME, good support system. Seat Belt Safety/Bike Helmet: Wears seat belt.      Objective:     Vitals: BP 137/62   Pulse 60   Resp 17   Ht 5\' 5"  (1.651 m)   Wt 154 lb (69.9 kg)   SpO2 99%   BMI 25.63 kg/m   Body mass index is 25.63 kg/m.  Advanced Directives 04/29/2018 06/17/2016 06/17/2016 06/17/2016  Does Patient Have a Medical Advance Directive? Yes - Yes No  Type of Paramedic of [place];Living will Living will Living will -  Copy of [place] in Chart? No - copy requested - - -  Would patient like information on creating a medical advance directive? - No - Patient declined - No - patient declined information    Tobacco Social History   Tobacco Use  Smoking Status Never Smoker  Smokeless Tobacco Never Used     Counseling given: Not Answered  Past Medical History:  Diagnosis Date  . Hyperlipidemia   . Hypertension   . Mild anemia   . Mild aortic stenosis   . Mild tricuspid regurgitation   . Osteopenia   . Vitamin D deficiency    Past Surgical History:  Procedure Laterality Date  . cataract surgery  2007   bilaterally  . gravida 0,para 0,mis 0, Dr.Gottsegen    . HEMORRHOID SURGERY    . no colonoscopy     "I never had a problem" ([place] reviewed)  . synvisc injections; r knee     Dr Lynann Bologna   Family  History  Problem Relation Age of Onset  . Arthritis Mother   . Heart disease Brother        MI in 65's  . Diabetes Maternal Grandmother   . Heart disease Cousin        MI  . Breast cancer Sister   . Stroke Neg Hx    Social History   Socioeconomic History  . Marital status: Widowed    Spouse name: Not on file  . Number of children: 1  . Years of education: Not on file  . Highest education level: Not on file  Occupational History  . Occupation: retired    Fish farm manager: RETIRED  Social Needs  . Financial resource strain: Not hard at all  . Food insecurity:    Worry: Never true    Inability: Never true  . Transportation needs:    Medical: No    Non-medical: No  Tobacco Use  . Smoking status: Never Smoker  . Smokeless tobacco: Never Used  Substance and Sexual Activity  . Alcohol use: Yes    Comment: rarely  . Drug use: No  . Sexual activity: Never  Lifestyle  . Physical activity:    Days per week: 3 days    Minutes per session: 40 min  . Stress: Not at all  Relationships  . Social connections:  Talks on phone: More than three times a week    Gets together: More than three times a week    Attends religious service: More than 4 times per year    Active member of club or organization: Yes    Attends meetings of clubs or organizations: More than 4 times per year    Relationship status: Widowed  Other Topics Concern  . Not on file  Social History Narrative   No diet          Outpatient Encounter Medications as of 04/29/2018  Medication Sig  . amLODipine (NORVASC) 2.5 MG tablet TAKE 1 TABLET DAILY  . aspirin 81 MG tablet Take 81 mg by mouth daily.    Marland Kitchen CALCIUM-VITAMIN D PO Take 1 tablet by mouth daily.   . cetirizine (ZYRTEC ALLERGY) 10 MG tablet Take 10 mg by mouth daily as needed for allergies.   . Cholecalciferol (VITAMIN D3) 1000 UNITS CAPS Take 1,000 mg by mouth daily.    . fluticasone (FLONASE) 50 MCG/ACT nasal spray USE 1 SPRAY IN EACH NOSTRIL TWICE A DAY AS  NEEDED FOR ALLERGIES OR RHINITIS.  Marland Kitchen Ibuprofen (ADVIL PO) Take 1 tablet by mouth daily as needed (PAIN).  Marland Kitchen L-Lysine 500 MG TABS Take 500 mg by mouth daily.    Marland Kitchen levocetirizine (XYZAL) 5 MG tablet Take 1 tablet (5 mg total) by mouth every evening.  Marland Kitchen losartan-hydrochlorothiazide (HYZAAR) 100-25 MG tablet TAKE 1 TABLET DAILY  . metoprolol tartrate (LOPRESSOR) 25 MG tablet TAKE 1 TABLET TWICE A DAY  . Multiple Vitamins-Calcium (VIACTIV MULTI-VITAMIN PO) Take 2 tablets by mouth daily.   . Omega-3 Fatty Acids (FISH OIL) 1000 MG CAPS Take 1,000 mg by mouth 2 (two) times daily.   . vitamin C (ASCORBIC ACID) 500 MG tablet Take 500 mg by mouth daily.   . vitamin E (VITAMIN E) 400 UNIT capsule Take 400 Units by mouth daily.    . [DISCONTINUED] fluticasone (FLONASE) 50 MCG/ACT nasal spray USE 1 SPRAY IN EACH NOSTRIL TWICE A DAY AS NEEDED FOR ALLERGIES OR RHINITIS.  . [DISCONTINUED] levocetirizine (XYZAL) 5 MG tablet Take 1 tablet (5 mg total) by mouth every evening.   No facility-administered encounter medications on file as of 04/29/2018.     Activities of Daily Living In your present state of health, do you have any difficulty performing the following activities: 04/29/2018  Hearing? N  Vision? N  Difficulty concentrating or making decisions? N  Walking or climbing stairs? N  Dressing or bathing? N  Doing errands, shopping? N  Preparing Food and eating ? N  Using the Toilet? N  In the past six months, have you accidently leaked urine? N  Do you have problems with loss of bowel control? N  Managing your Medications? N  Managing your Finances? N  Housekeeping or managing your Housekeeping? N  Some recent data might be hidden    Patient Care Team: Binnie Rail, MD as PCP - General (Internal Medicine)    Assessment:   This is a routine wellness examination for Denise Wagner. Physical assessment deferred to PCP.   Exercise Activities and Dietary recommendations Current Exercise Habits: Home  exercise routine, Type of exercise: walking(water aerobics ), Time (Minutes): 40, Frequency (Times/Week): 3, Weekly Exercise (Minutes/Week): 120, Intensity: Mild, Exercise limited by: orthopedic condition(s)  Diet (meal preparation, eat out, water intake, caffeinated beverages, dairy products, fruits and vegetables): in general, a "healthy" diet  , well balanced   Reviewed heart healthy and diabetic diet.  Encouraged patient to increase daily water and healthy fluid intake.  Goals    . Patient Stated     Continue to stay busy, active physically and socially, stay active with church, and enjoy family and life.       Fall Risk Fall Risk  04/29/2018 03/25/2016 08/29/2014 08/26/2013 07/14/2012  Falls in the past year? No No No No No  Comment - Emmi Telephone Survey: data to providers prior to load - - -    Depression Screen PHQ 2/9 Scores 04/29/2018 08/29/2014 08/26/2013 07/14/2012  PHQ - 2 Score 0 0 0 0     Cognitive Function MMSE - Mini Mental State Exam 04/29/2018  Orientation to time 5  Orientation to Place 5  Registration 3  Attention/ Calculation 4  Recall 0  Language- name 2 objects 2  Language- repeat 1  Language- follow 3 step command 3  Language- read & follow direction 1  Write a sentence 1  Copy design 1  Total score 26        Immunization History  Administered Date(s) Administered  . Influenza Split 05/22/2011, 04/28/2012  . Influenza Whole 05/11/2007, 04/19/2008, 07/05/2008, 05/15/2009, 04/29/2010  . Influenza, High Dose Seasonal PF 05/25/2013, 04/30/2016, 05/07/2017  . Influenza,inj,Quad PF,6+ Mos 05/05/2014, 05/10/2015  . Pneumococcal Conjugate-13 05/05/2014  . Pneumococcal Polysaccharide-23 07/29/1999  . Td 01/04/1997  . Tetanus 07/14/2012  . Zoster 06/08/2014   Screening Tests Health Maintenance  Topic Date Due  . INFLUENZA VACCINE  02/25/2018  . DEXA SCAN  10/28/2018 (Originally 05/13/1991)  . TETANUS/TDAP  07/14/2022  . PNA vac Low Risk Adult   Completed      Plan:     Continue doing brain stimulating activities (puzzles, reading, adult coloring books, staying active) to keep memory sharp.   Continue to eat heart healthy diet (full of fruits, vegetables, whole grains, lean protein, water--limit salt, fat, and sugar intake) and increase physical activity as tolerated.  I have personally reviewed and noted the following in the patient's chart:   . Medical and social history . Use of alcohol, tobacco or illicit drugs  . Current medications and supplements . Functional ability and status . Nutritional status . Physical activity . Advanced directives . List of other physicians . Hospitalizations, surgeries, and ER visits in previous 12 months . Vitals . Screenings to include cognitive, depression, and falls . Referrals and appointments  In addition, I have reviewed and discussed with patient certain preventive protocols, quality metrics, and best practice recommendations. A written personalized care plan for preventive services as well as general preventive health recommendations were provided to patient.     Michiel Cowboy, RN  04/29/2018   Medical screening examination/treatment/procedure(s) were performed by non-physician practitioner and as supervising physician I was immediately available for consultation/collaboration. I agree with above. Lew Dawes, MD

## 2018-04-29 ENCOUNTER — Ambulatory Visit (INDEPENDENT_AMBULATORY_CARE_PROVIDER_SITE_OTHER): Payer: Medicare Other | Admitting: *Deleted

## 2018-04-29 VITALS — BP 137/62 | HR 60 | Resp 17 | Ht 65.0 in | Wt 154.0 lb

## 2018-04-29 DIAGNOSIS — Z0000 Encounter for general adult medical examination without abnormal findings: Secondary | ICD-10-CM | POA: Diagnosis not present

## 2018-04-29 DIAGNOSIS — Z23 Encounter for immunization: Secondary | ICD-10-CM

## 2018-04-29 MED ORDER — LEVOCETIRIZINE DIHYDROCHLORIDE 5 MG PO TABS: 5.0000 mg | ORAL_TABLET | Freq: Every evening | ORAL | 1 refills | Status: DC

## 2018-04-29 MED ORDER — FLUTICASONE PROPIONATE 50 MCG/ACT NA SUSP: NASAL | 1 refills | Status: DC

## 2018-04-29 NOTE — Patient Instructions (Signed)
Continue doing brain stimulating activities (puzzles, reading, adult coloring books, staying active) to keep memory sharp.   Continue to eat heart healthy diet (full of fruits, vegetables, whole grains, lean protein, water--limit salt, fat, and sugar intake) and increase physical activity as tolerated.   Denise Wagner , Thank you for taking time to come for your Medicare Wellness Visit. I appreciate your ongoing commitment to your health goals. Please review the following plan we discussed and let me know if I can assist you in the future.   These are the goals we discussed: Goals    . Patient Stated     Continue to stay busy, active physically and socially, stay active with church, and enjoy family and life.       This is a list of the screening recommended for you and due dates:  Health Maintenance  Topic Date Due  . Flu Shot  02/25/2018  . DEXA scan (bone density measurement)  10/28/2018*  . Tetanus Vaccine  07/14/2022  . Pneumonia vaccines  Completed  *Topic was postponed. The date shown is not the original due date.   Influenza Virus Vaccine injection What is this medicine? INFLUENZA VIRUS VACCINE (in floo EN zuh VAHY ruhs vak SEEN) helps to reduce the risk of getting influenza also known as the flu. The vaccine only helps protect you against some strains of the flu. This medicine may be used for other purposes; ask your health care provider or pharmacist if you have questions. COMMON BRAND NAME(S): Afluria, Agriflu, Alfuria, FLUAD, Fluarix, Fluarix Quadrivalent, Flublok, Flublok Quadrivalent, FLUCELVAX, Flulaval, Fluvirin, Fluzone, Fluzone High-Dose, Fluzone Intradermal What should I tell my health care provider before I take this medicine? They need to know if you have any of these conditions: -bleeding disorder like hemophilia -fever or infection -Guillain-Barre syndrome or other neurological problems -immune system problems -infection with the human immunodeficiency virus  (HIV) or AIDS -low blood platelet counts -multiple sclerosis -an unusual or allergic reaction to influenza virus vaccine, latex, other medicines, foods, dyes, or preservatives. Different brands of vaccines contain different allergens. Some may contain latex or eggs. Talk to your doctor about your allergies to make sure that you get the right vaccine. -pregnant or trying to get pregnant -breast-feeding How should I use this medicine? This vaccine is for injection into a muscle or under the skin. It is given by a health care professional. A copy of Vaccine Information Statements will be given before each vaccination. Read this sheet carefully each time. The sheet may change frequently. Talk to your healthcare provider to see which vaccines are right for you. Some vaccines should not be used in all age groups. Overdosage: If you think you have taken too much of this medicine contact a poison control center or emergency room at once. NOTE: This medicine is only for you. Do not share this medicine with others. What if I miss a dose? This does not apply. What may interact with this medicine? -chemotherapy or radiation therapy -medicines that lower your immune system like etanercept, anakinra, infliximab, and adalimumab -medicines that treat or prevent blood clots like warfarin -phenytoin -steroid medicines like prednisone or cortisone -theophylline -vaccines This list may not describe all possible interactions. Give your health care provider a list of all the medicines, herbs, non-prescription drugs, or dietary supplements you use. Also tell them if you smoke, drink alcohol, or use illegal drugs. Some items may interact with your medicine. What should I watch for while using this medicine? Report any  side effects that do not go away within 3 days to your doctor or health care professional. Call your health care provider if any unusual symptoms occur within 6 weeks of receiving this vaccine. You  may still catch the flu, but the illness is not usually as bad. You cannot get the flu from the vaccine. The vaccine will not protect against colds or other illnesses that may cause fever. The vaccine is needed every year. What side effects may I notice from receiving this medicine? Side effects that you should report to your doctor or health care professional as soon as possible: -allergic reactions like skin rash, itching or hives, swelling of the face, lips, or tongue Side effects that usually do not require medical attention (report to your doctor or health care professional if they continue or are bothersome): -fever -headache -muscle aches and pains -pain, tenderness, redness, or swelling at the injection site -tiredness This list may not describe all possible side effects. Call your doctor for medical advice about side effects. You may report side effects to FDA at 1-800-FDA-1088. Where should I keep my medicine? The vaccine will be given by a health care professional in a clinic, pharmacy, doctor's office, or other health care setting. You will not be given vaccine doses to store at home. NOTE: This sheet is a summary. It may not cover all possible information. If you have questions about this medicine, talk to your doctor, pharmacist, or health care provider.  2018 Elsevier/Gold Standard (2015-02-02 10:07:28)  Health Maintenance, Female Adopting a healthy lifestyle and getting preventive care can go a long way to promote health and wellness. Talk with your health care provider about what schedule of regular examinations is right for you. This is a good chance for you to check in with your provider about disease prevention and staying healthy. In between checkups, there are plenty of things you can do on your own. Experts have done a lot of research about which lifestyle changes and preventive measures are most likely to keep you healthy. Ask your health care provider for more  information. Weight and diet Eat a healthy diet  Be sure to include plenty of vegetables, fruits, low-fat dairy products, and lean protein.  Do not eat a lot of foods high in solid fats, added sugars, or salt.  Get regular exercise. This is one of the most important things you can do for your health. ? Most adults should exercise for at least 150 minutes each week. The exercise should increase your heart rate and make you sweat (moderate-intensity exercise). ? Most adults should also do strengthening exercises at least twice a week. This is in addition to the moderate-intensity exercise.  Maintain a healthy weight  Body mass index (BMI) is a measurement that can be used to identify possible weight problems. It estimates body fat based on height and weight. Your health care provider can help determine your BMI and help you achieve or maintain a healthy weight.  For females 36 years of age and older: ? A BMI below 18.5 is considered underweight. ? A BMI of 18.5 to 24.9 is normal. ? A BMI of 25 to 29.9 is considered overweight. ? A BMI of 30 and above is considered obese.  Watch levels of cholesterol and blood lipids  You should start having your blood tested for lipids and cholesterol at 82 years of age, then have this test every 5 years.  You may need to have your cholesterol levels checked more often  if: ? Your lipid or cholesterol levels are high. ? You are older than 82 years of age. ? You are at high risk for heart disease.  Cancer screening Lung Cancer  Lung cancer screening is recommended for adults 86-27 years old who are at high risk for lung cancer because of a history of smoking.  A yearly low-dose CT scan of the lungs is recommended for people who: ? Currently smoke. ? Have quit within the past 15 years. ? Have at least a 30-pack-year history of smoking. A pack year is smoking an average of one pack of cigarettes a day for 1 year.  Yearly screening should continue  until it has been 15 years since you quit.  Yearly screening should stop if you develop a health problem that would prevent you from having lung cancer treatment.  Breast Cancer  Practice breast self-awareness. This means understanding how your breasts normally appear and feel.  It also means doing regular breast self-exams. Let your health care provider know about any changes, no matter how small.  If you are in your 20s or 30s, you should have a clinical breast exam (CBE) by a health care provider every 1-3 years as part of a regular health exam.  If you are 60 or older, have a CBE every year. Also consider having a breast X-ray (mammogram) every year.  If you have a family history of breast cancer, talk to your health care provider about genetic screening.  If you are at high risk for breast cancer, talk to your health care provider about having an MRI and a mammogram every year.  Breast cancer gene (BRCA) assessment is recommended for women who have family members with BRCA-related cancers. BRCA-related cancers include: ? Breast. ? Ovarian. ? Tubal. ? Peritoneal cancers.  Results of the assessment will determine the need for genetic counseling and BRCA1 and BRCA2 testing.  Cervical Cancer Your health care provider may recommend that you be screened regularly for cancer of the pelvic organs (ovaries, uterus, and vagina). This screening involves a pelvic examination, including checking for microscopic changes to the surface of your cervix (Pap test). You may be encouraged to have this screening done every 3 years, beginning at age 80.  For women ages 15-65, health care providers may recommend pelvic exams and Pap testing every 3 years, or they may recommend the Pap and pelvic exam, combined with testing for human papilloma virus (HPV), every 5 years. Some types of HPV increase your risk of cervical cancer. Testing for HPV may also be done on women of any age with unclear Pap test  results.  Other health care providers may not recommend any screening for nonpregnant women who are considered low risk for pelvic cancer and who do not have symptoms. Ask your health care provider if a screening pelvic exam is right for you.  If you have had past treatment for cervical cancer or a condition that could lead to cancer, you need Pap tests and screening for cancer for at least 20 years after your treatment. If Pap tests have been discontinued, your risk factors (such as having a new sexual partner) need to be reassessed to determine if screening should resume. Some women have medical problems that increase the chance of getting cervical cancer. In these cases, your health care provider may recommend more frequent screening and Pap tests.  Colorectal Cancer  This type of cancer can be detected and often prevented.  Routine colorectal cancer screening usually begins at  82 years of age and continues through 82 years of age.  Your health care provider may recommend screening at an earlier age if you have risk factors for colon cancer.  Your health care provider may also recommend using home test kits to check for hidden blood in the stool.  A small camera at the end of a tube can be used to examine your colon directly (sigmoidoscopy or colonoscopy). This is done to check for the earliest forms of colorectal cancer.  Routine screening usually begins at age 72.  Direct examination of the colon should be repeated every 5-10 years through 82 years of age. However, you may need to be screened more often if early forms of precancerous polyps or small growths are found.  Skin Cancer  Check your skin from head to toe regularly.  Tell your health care provider about any new moles or changes in moles, especially if there is a change in a mole's shape or color.  Also tell your health care provider if you have a mole that is larger than the size of a pencil eraser.  Always use sunscreen.  Apply sunscreen liberally and repeatedly throughout the day.  Protect yourself by wearing long sleeves, pants, a wide-brimmed hat, and sunglasses whenever you are outside.  Heart disease, diabetes, and high blood pressure  High blood pressure causes heart disease and increases the risk of stroke. High blood pressure is more likely to develop in: ? People who have blood pressure in the high end of the normal range (130-139/85-89 mm Hg). ? People who are overweight or obese. ? People who are African American.  If you are 18-40 years of age, have your blood pressure checked every 3-5 years. If you are 32 years of age or older, have your blood pressure checked every year. You should have your blood pressure measured twice-once when you are at a hospital or clinic, and once when you are not at a hospital or clinic. Record the average of the two measurements. To check your blood pressure when you are not at a hospital or clinic, you can use: ? An automated blood pressure machine at a pharmacy. ? A home blood pressure monitor.  If you are between 43 years and 17 years old, ask your health care provider if you should take aspirin to prevent strokes.  Have regular diabetes screenings. This involves taking a blood sample to check your fasting blood sugar level. ? If you are at a normal weight and have a low risk for diabetes, have this test once every three years after 82 years of age. ? If you are overweight and have a high risk for diabetes, consider being tested at a younger age or more often. Preventing infection Hepatitis B  If you have a higher risk for hepatitis B, you should be screened for this virus. You are considered at high risk for hepatitis B if: ? You were born in a country where hepatitis B is common. Ask your health care provider which countries are considered high risk. ? Your parents were born in a high-risk country, and you have not been immunized against hepatitis B (hepatitis B  vaccine). ? You have HIV or AIDS. ? You use needles to inject street drugs. ? You live with someone who has hepatitis B. ? You have had sex with someone who has hepatitis B. ? You get hemodialysis treatment. ? You take certain medicines for conditions, including cancer, organ transplantation, and autoimmune conditions.  Hepatitis C  Blood testing is recommended for: ? Everyone born from [birth year] through 1963. ? Anyone with known risk factors for hepatitis C.  Sexually transmitted infections (STIs)  You should be screened for sexually transmitted infections (STIs) including gonorrhea and chlamydia if: ? You are sexually active and are younger than 82 years of age. ? You are older than 82 years of age and your health care provider tells you that you are at risk for this type of infection. ? Your sexual activity has changed since you were last screened and you are at an increased risk for chlamydia or gonorrhea. Ask your health care provider if you are at risk.  If you do not have HIV, but are at risk, it may be recommended that you take a prescription medicine daily to prevent HIV infection. This is called pre-exposure prophylaxis (PrEP). You are considered at risk if: ? You are sexually active and do not regularly use condoms or know the HIV status of your partner(s). ? You take drugs by injection. ? You are sexually active with a partner who has HIV.  Talk with your health care provider about whether you are at high risk of being infected with HIV. If you choose to begin PrEP, you should first be tested for HIV. You should then be tested every 3 months for as long as you are taking PrEP. Pregnancy  If you are premenopausal and you may become pregnant, ask your health care provider about preconception counseling.  If you may become pregnant, take 400 to 800 micrograms (mcg) of folic acid every day.  If you want to prevent pregnancy, talk to your health care provider about birth control  (contraception). Osteoporosis and menopause  Osteoporosis is a disease in which the bones lose minerals and strength with aging. This can result in serious bone fractures. Your risk for osteoporosis can be identified using a bone density scan.  If you are 33 years of age or older, or if you are at risk for osteoporosis and fractures, ask your health care provider if you should be screened.  Ask your health care provider whether you should take a calcium or vitamin D supplement to lower your risk for osteoporosis.  Menopause may have certain physical symptoms and risks.  Hormone replacement therapy may reduce some of these symptoms and risks. Talk to your health care provider about whether hormone replacement therapy is right for you. Follow these instructions at home:  Schedule regular health, dental, and eye exams.  Stay current with your immunizations.  Do not use any tobacco products including cigarettes, chewing tobacco, or electronic cigarettes.  If you are pregnant, do not drink alcohol.  If you are breastfeeding, limit how much and how often you drink alcohol.  Limit alcohol intake to no more than 1 drink per day for nonpregnant women. One drink equals 12 ounces of beer, 5 ounces of wine, or 1 ounces of hard liquor.  Do not use street drugs.  Do not share needles.  Ask your health care provider for help if you need support or information about quitting drugs.  Tell your health care provider if you often feel depressed.  Tell your health care provider if you have ever been abused or do not feel safe at home. This information is not intended to replace advice given to you by your health care provider. Make sure you discuss any questions you have with your health care provider. Document Released: 01/27/2011 Document Revised: 12/20/2015 Document Reviewed: 04/17/2015 Elsevier Interactive Patient  Education  2018 [employer].

## 2018-07-04 ENCOUNTER — Other Ambulatory Visit: Payer: Self-pay | Admitting: Internal Medicine

## 2018-08-25 DIAGNOSIS — H52203 Unspecified astigmatism, bilateral: Secondary | ICD-10-CM | POA: Diagnosis not present

## 2018-08-25 DIAGNOSIS — Z961 Presence of intraocular lens: Secondary | ICD-10-CM | POA: Diagnosis not present

## 2018-10-03 ENCOUNTER — Other Ambulatory Visit: Payer: Self-pay | Admitting: Internal Medicine

## 2018-10-14 ENCOUNTER — Other Ambulatory Visit: Payer: Self-pay | Admitting: Internal Medicine

## 2018-11-02 ENCOUNTER — Ambulatory Visit: Payer: Medicare Other | Admitting: Internal Medicine

## 2018-11-12 ENCOUNTER — Other Ambulatory Visit: Payer: Self-pay | Admitting: Internal Medicine

## 2018-11-12 DIAGNOSIS — I10 Essential (primary) hypertension: Secondary | ICD-10-CM

## 2018-12-27 NOTE — Progress Notes (Signed)
Subjective:    Patient ID: Denise Wagner, female    DOB: Feb 17, 1924, 85 y.o.   MRN: [id number]  HPI The patient is here for follow up.  She is not exercising regularly.   She usually does water exercises and is waiting for the pool to open.    Hypertension: She is taking her medication daily. She is compliant with a low sodium diet.  She has chronic leg edema that is unchanged.  She denies chest pain, palpitations, shortness of breath and regular headaches.    Hyperlipidemia: She is not on ay medication.  She is compliant with a low fat/cholesterol diet.    Led edema:  Her legs and feet swell up during the day.  The swelling goes away over night.  It bothers her at times.    Osteopenia:  She is taking calcium and vitamin d daily.  She does water exercises in the summer, but has not started that because the pool is not open.  She is not exercising regularly.  She does not want a dexa and would not want to consider medication.        Medications and allergies reviewed with patient and updated if appropriate.  Patient Active Problem List   Diagnosis Date Noted  . Allergic rhinitis 12/08/2016  . Vitamin D deficiency 10/25/2007  . Hyperlipidemia 10/25/2007  . Essential hypertension 10/25/2007  . Osteopenia 08/18/2007    Current Outpatient Medications on File Prior to Visit  Medication Sig Dispense Refill  . amLODipine (NORVASC) 2.5 MG tablet TAKE 1 TABLET DAILY 90 tablet 4  . aspirin 81 MG tablet Take 81 mg by mouth daily.      Marland Kitchen CALCIUM-VITAMIN D PO Take 1 tablet by mouth daily.     . cetirizine (ZYRTEC ALLERGY) 10 MG tablet Take 10 mg by mouth daily as needed for allergies.     . Cholecalciferol (VITAMIN D3) 1000 UNITS CAPS Take 1,000 mg by mouth daily.      . fluticasone (FLONASE) 50 MCG/ACT nasal spray USE 1 SPRAY IN EACH NOSTRIL TWICE A DAY AS NEEDED FOR ALLERGIES OR RHINITIS. 48 g 1  . Ibuprofen (ADVIL PO) Take 1 tablet by mouth daily as needed (PAIN).    Marland Kitchen L-Lysine  500 MG TABS Take 500 mg by mouth daily.      Marland Kitchen levocetirizine (XYZAL) 5 MG tablet TAKE 1 TABLET EVERY EVENING 90 tablet 1  . losartan-hydrochlorothiazide (HYZAAR) 100-25 MG tablet TAKE 1 TABLET DAILY 90 tablet 1  . metoprolol tartrate (LOPRESSOR) 25 MG tablet TAKE 1 TABLET TWICE A DAY 180 tablet 0  . Multiple Vitamins-Calcium (VIACTIV MULTI-VITAMIN PO) Take 2 tablets by mouth daily.     . Omega-3 Fatty Acids (FISH OIL) 1000 MG CAPS Take 1,000 mg by mouth 2 (two) times daily.     . vitamin C (ASCORBIC ACID) 500 MG tablet Take 500 mg by mouth daily.     . vitamin E (VITAMIN E) 400 UNIT capsule Take 400 Units by mouth daily.       No current facility-administered medications on file prior to visit.     Past Medical History:  Diagnosis Date  . Hyperlipidemia   . Hypertension   . Mild anemia   . Mild aortic stenosis   . Mild tricuspid regurgitation   . Osteopenia   . Vitamin D deficiency     Past Surgical History:  Procedure Laterality Date  . cataract surgery  2007   bilaterally  . gravida 0,para  0,mis 0, Dr.Gottsegen    . HEMORRHOID SURGERY    . no colonoscopy     "I never had a problem" ([facility] reviewed)  . synvisc injections; r knee     Dr Lynann Bologna    Social History   Socioeconomic History  . Marital status: Widowed    Spouse name: Not on file  . Number of children: 1  . Years of education: Not on file  . Highest education level: Not on file  Occupational History  . Occupation: retired    Fish farm manager: RETIRED  Social Needs  . Financial resource strain: Not hard at all  . Food insecurity:    Worry: Never true    Inability: Never true  . Transportation needs:    Medical: No    Non-medical: No  Tobacco Use  . Smoking status: Never Smoker  . Smokeless tobacco: Never Used  Substance and Sexual Activity  . Alcohol use: Yes    Comment: rarely  . Drug use: No  . Sexual activity: Never  Lifestyle  . Physical activity:    Days per week: 3 days    Minutes per session: 40  min  . Stress: Not at all  Relationships  . Social connections:    Talks on phone: More than three times a week    Gets together: More than three times a week    Attends religious service: More than 4 times per year    Active member of club or organization: Yes    Attends meetings of clubs or organizations: More than 4 times per year    Relationship status: Widowed  Other Topics Concern  . Not on file  Social History Narrative   No diet          Family History  Problem Relation Age of Onset  . Arthritis Mother   . Heart disease Brother        MI in 67's  . Diabetes Maternal Grandmother   . Heart disease Cousin        MI  . Breast cancer Sister   . Stroke Neg Hx     Review of Systems  Constitutional: Negative for chills and fever.  Respiratory: Negative for cough, shortness of breath and wheezing.   Cardiovascular: Positive for leg swelling (chronic, no change). Negative for chest pain and palpitations.  Gastrointestinal: Negative for abdominal pain.  Neurological: Positive for headaches (occ). Negative for light-headedness.       Objective:   Vitals:   12/28/18 0854  BP: (!) 146/60  Pulse: (!) 58  Resp: 16  Temp: 98.1 F (36.7 C)  SpO2: 97%   BP Readings from Last 3 Encounters:  12/28/18 (!) 146/60  04/29/18 137/62  10/27/17 (!) 146/66   Wt Readings from Last 3 Encounters:  12/28/18 152 lb (68.9 kg)  04/29/18 154 lb (69.9 kg)  10/27/17 152 lb (68.9 kg)   Body mass index is 25.29 kg/m.   Physical Exam    Constitutional: Appears well-developed and well-nourished. No distress.  HENT:  Head: Normocephalic and atraumatic.  Neck: Neck supple. No tracheal deviation present. No thyromegaly present.  No cervical lymphadenopathy Cardiovascular: Normal rate, regular rhythm and normal heart sounds.   2/6 sys murmur heard. No carotid bruit .  Mild B/L LE edema Pulmonary/Chest: Effort normal and breath sounds normal. No respiratory distress. No has no wheezes.  No rales.  Skin: Skin is warm and dry. Not diaphoretic.  Psychiatric: Normal mood and affect. Behavior is normal.  Assessment & Plan:    See Problem List for Assessment and Plan of chronic medical problems.

## 2018-12-28 ENCOUNTER — Ambulatory Visit (INDEPENDENT_AMBULATORY_CARE_PROVIDER_SITE_OTHER): Payer: Medicare Other | Admitting: Internal Medicine

## 2018-12-28 ENCOUNTER — Encounter: Payer: Self-pay | Admitting: Internal Medicine

## 2018-12-28 ENCOUNTER — Other Ambulatory Visit (INDEPENDENT_AMBULATORY_CARE_PROVIDER_SITE_OTHER): Payer: Medicare Other

## 2018-12-28 ENCOUNTER — Other Ambulatory Visit: Payer: Self-pay

## 2018-12-28 VITALS — BP 146/60 | HR 58 | Temp 98.1°F | Resp 16 | Ht 65.0 in | Wt 152.0 lb

## 2018-12-28 DIAGNOSIS — I10 Essential (primary) hypertension: Secondary | ICD-10-CM

## 2018-12-28 DIAGNOSIS — M8580 Other specified disorders of bone density and structure, unspecified site: Secondary | ICD-10-CM

## 2018-12-28 DIAGNOSIS — E785 Hyperlipidemia, unspecified: Secondary | ICD-10-CM | POA: Diagnosis not present

## 2018-12-28 LAB — CBC WITH DIFFERENTIAL/PLATELET
Basophils Absolute: 0 10*3/uL (ref 0.0–0.1)
Basophils Relative: 0.4 % (ref 0.0–3.0)
Eosinophils Absolute: 0.2 10*3/uL (ref 0.0–0.7)
Eosinophils Relative: 2.1 % (ref 0.0–5.0)
HCT: 36.5 % (ref 36.0–46.0)
Hemoglobin: 12.9 g/dL (ref 12.0–15.0)
Lymphocytes Relative: 28.9 % (ref 12.0–46.0)
Lymphs Abs: 2.3 10*3/uL (ref 0.7–4.0)
MCHC: 35.2 g/dL (ref 30.0–36.0)
MCV: 92.9 fl (ref 78.0–100.0)
Monocytes Absolute: 0.7 10*3/uL (ref 0.1–1.0)
Monocytes Relative: 8.2 % (ref 3.0–12.0)
Neutro Abs: 4.8 10*3/uL (ref 1.4–7.7)
Neutrophils Relative %: 60.4 % (ref 43.0–77.0)
Platelets: 201 10*3/uL (ref 150.0–400.0)
RBC: 3.93 Mil/uL (ref 3.87–5.11)
RDW: 13 % (ref 11.5–15.5)
WBC: 8 10*3/uL (ref 4.0–10.5)

## 2018-12-28 LAB — COMPREHENSIVE METABOLIC PANEL
ALT: 11 U/L (ref 0–35)
AST: 13 U/L (ref 0–37)
Albumin: 3.8 g/dL (ref 3.5–5.2)
Alkaline Phosphatase: 37 U/L — ABNORMAL LOW (ref 39–117)
BUN: 19 mg/dL (ref 6–23)
CO2: 29 mEq/L (ref 19–32)
Calcium: 9.7 mg/dL (ref 8.4–10.5)
Chloride: 98 mEq/L (ref 96–112)
Creatinine, Ser: 0.92 mg/dL (ref 0.40–1.20)
GFR: 57.01 mL/min — ABNORMAL LOW (ref >60.00)
Glucose, Bld: 88 mg/dL (ref 70–99)
Potassium: 4.4 mEq/L (ref 3.5–5.1)
Sodium: 132 mEq/L — ABNORMAL LOW (ref 135–145)
Total Bilirubin: 0.7 mg/dL (ref 0.2–1.2)
Total Protein: 6.2 g/dL (ref 6.0–8.3)

## 2018-12-28 LAB — LIPID PANEL
Cholesterol: 206 mg/dL — ABNORMAL HIGH (ref 0–200)
HDL: 52.3 mg/dL (ref >39.00)
LDL Cholesterol: 135 mg/dL — ABNORMAL HIGH (ref 0–99)
NonHDL: 153.34
Total CHOL/HDL Ratio: 4
Triglycerides: 91 mg/dL (ref 0.0–149.0)
VLDL: 18.2 mg/dL (ref 0.0–40.0)

## 2018-12-28 LAB — TSH: TSH: 1.69 u[IU]/mL (ref 0.35–4.50)

## 2018-12-28 NOTE — Assessment & Plan Note (Signed)
Did not tolerate statin in the past Diet controlled Check lipid, cmp, tsh

## 2018-12-28 NOTE — Assessment & Plan Note (Signed)
BP well controlled Current regimen effective and well tolerated Continue current medications at current doses Cmp, tsh, cbc

## 2018-12-28 NOTE — Assessment & Plan Note (Addendum)
Defers dexa Does not want to consider medication so sees no need for dexa Taking calcium and vitamin d Does water exercises in summer, no other exercise Cbc, tsh, cmp

## 2018-12-28 NOTE — Patient Instructions (Signed)
  Tests ordered today. Your results will be released to [place] (or called to you) after review, usually within 72hours after test completion. If any changes need to be made, you will be notified at that same time.   Medications reviewed and updated.  Changes include :   none    Please followup in one year, sooner if needed

## 2019-02-10 ENCOUNTER — Other Ambulatory Visit: Payer: Self-pay | Admitting: Internal Medicine

## 2019-02-10 DIAGNOSIS — I10 Essential (primary) hypertension: Secondary | ICD-10-CM

## 2019-02-22 DIAGNOSIS — M1711 Unilateral primary osteoarthritis, right knee: Secondary | ICD-10-CM | POA: Diagnosis not present

## 2019-03-01 DIAGNOSIS — M1711 Unilateral primary osteoarthritis, right knee: Secondary | ICD-10-CM | POA: Diagnosis not present

## 2019-03-08 DIAGNOSIS — M1711 Unilateral primary osteoarthritis, right knee: Secondary | ICD-10-CM | POA: Diagnosis not present

## 2019-04-12 ENCOUNTER — Other Ambulatory Visit: Payer: Self-pay | Admitting: Internal Medicine

## 2019-04-25 DIAGNOSIS — H0011 Chalazion right upper eyelid: Secondary | ICD-10-CM | POA: Diagnosis not present

## 2019-05-05 ENCOUNTER — Other Ambulatory Visit: Payer: Self-pay

## 2019-05-05 ENCOUNTER — Ambulatory Visit (INDEPENDENT_AMBULATORY_CARE_PROVIDER_SITE_OTHER): Payer: Medicare Other | Admitting: *Deleted

## 2019-05-05 VITALS — BP 130/58 | HR 57 | Temp 98.4°F | Resp 17 | Ht 65.0 in | Wt 153.0 lb

## 2019-05-05 DIAGNOSIS — Z23 Encounter for immunization: Secondary | ICD-10-CM | POA: Diagnosis not present

## 2019-05-05 DIAGNOSIS — Z0000 Encounter for general adult medical examination without abnormal findings: Secondary | ICD-10-CM

## 2019-05-05 NOTE — Patient Instructions (Addendum)
Continue doing brain stimulating activities (puzzles, reading, adult coloring books, staying active) to keep memory sharp.   Continue to eat heart healthy diet (full of fruits, vegetables, whole grains, lean protein, water--limit salt, fat, and sugar intake) and increase physical activity as tolerated.   Denise Wagner , Thank you for taking time to come for your Medicare Wellness Visit. I appreciate your ongoing commitment to your health goals. Please review the following plan we discussed and let me know if I can assist you in the future.   These are the goals we discussed: Goals    . Patient Stated     Continue to stay busy, active physically and socially, stay active with church, and enjoy family and life.       This is a list of the screening recommended for you and due dates:  Health Maintenance  Topic Date Due  . Flu Shot  02/26/2019  . DEXA scan (bone density measurement)  12/27/2028*  . Tetanus Vaccine  07/14/2022  . Pneumonia vaccines  Completed  *Topic was postponed. The date shown is not the original due date.    Preventive Care 70 Years and Older, Female Preventive care refers to lifestyle choices and visits with your health care provider that can promote health and wellness. This includes:  A yearly physical exam. This is also called an annual well check.  Regular dental and eye exams.  Immunizations.  Screening for certain conditions.  Healthy lifestyle choices, such as diet and exercise. What can I expect for my preventive care visit? Physical exam Your health care provider will check:  Height and weight. These may be used to calculate body mass index (BMI), which is a measurement that tells if you are at a healthy weight.  Heart rate and blood pressure.  Your skin for abnormal spots. Counseling Your health care provider may ask you questions about:  Alcohol, tobacco, and drug use.  Emotional well-being.  Home and relationship well-being.  Sexual  activity.  Eating habits.  History of falls.  Memory and ability to understand (cognition).  Work and work Statistician.  Pregnancy and menstrual history. What immunizations do I need?  Influenza (flu) vaccine  This is recommended every year. Tetanus, diphtheria, and pertussis (Tdap) vaccine  You may need a Td booster every 10 years. Varicella (chickenpox) vaccine  You may need this vaccine if you have not already been vaccinated. Zoster (shingles) vaccine  You may need this after age 33. Pneumococcal conjugate (PCV13) vaccine  One dose is recommended after age 42. Pneumococcal polysaccharide (PPSV23) vaccine  One dose is recommended after age 93. Measles, mumps, and rubella (MMR) vaccine  You may need at least one dose of MMR if you were born in 1957 or later. You may also need a second dose. Meningococcal conjugate (MenACWY) vaccine  You may need this if you have certain conditions. Hepatitis A vaccine  You may need this if you have certain conditions or if you travel or work in places where you may be exposed to hepatitis A. Hepatitis B vaccine  You may need this if you have certain conditions or if you travel or work in places where you may be exposed to hepatitis B. Haemophilus influenzae type b (Hib) vaccine  You may need this if you have certain conditions. You may receive vaccines as individual doses or as more than one vaccine together in one shot (combination vaccines). Talk with your health care provider about the risks and benefits of combination vaccines. What  tests do I need? Blood tests  Lipid and cholesterol levels. These may be checked every 5 years, or more frequently depending on your overall health.  Hepatitis C test.  Hepatitis B test. Screening  Lung cancer screening. You may have this screening every year starting at age 33 if you have a 30-pack-year history of smoking and currently smoke or have quit within the past 15  years.  Colorectal cancer screening. All adults should have this screening starting at age 74 and continuing until age 37. Your health care provider may recommend screening at age 69 if you are at increased risk. You will have tests every 1-10 years, depending on your results and the type of screening test.  Diabetes screening. This is done by checking your blood sugar (glucose) after you have not eaten for a while (fasting). You may have this done every 1-3 years.  Mammogram. This may be done every 1-2 years. Talk with your health care provider about how often you should have regular mammograms.  BRCA-related cancer screening. This may be done if you have a family history of breast, ovarian, tubal, or peritoneal cancers. Other tests  Sexually transmitted disease (STD) testing.  Bone density scan. This is done to screen for osteoporosis. You may have this done starting at age 58. Follow these instructions at home: Eating and drinking  Eat a diet that includes fresh fruits and vegetables, whole grains, lean protein, and low-fat dairy products. Limit your intake of foods with high amounts of sugar, saturated fats, and salt.  Take vitamin and mineral supplements as recommended by your health care provider.  Do not drink alcohol if your health care provider tells you not to drink.  If you drink alcohol: ? Limit how much you have to 0-1 drink a day. ? Be aware of how much alcohol is in your drink. In the [place] one drink equals one 12 oz bottle of beer (355 mL), one 5 oz glass of  (148 mL), or one 1 oz glass of hard liquor (44 mL). Lifestyle  Take daily care of your teeth and gums.  Stay active. Exercise for at least 30 minutes on 5 or more days each week.  Do not use any products that contain nicotine or tobacco, such as cigarettes, e-cigarettes, and chewing tobacco. If you need help quitting, ask your health care provider.  If you are sexually active, practice safe sex. Use a  condom or other form of protection in order to prevent STIs (sexually transmitted infections).  Talk with your health care provider about taking a low-dose aspirin or statin. What's next?  Go to your health care provider once a year for a well check visit.  Ask your health care provider how often you should have your eyes and teeth checked.  Stay up to date on all vaccines. This information is not intended to replace advice given to you by your health care provider. Make sure you discuss any questions you have with your health care provider. Document Released: 08/10/2015 Document Revised: 07/08/2018 Document Reviewed: 07/08/2018 Elsevier Patient Education  2020 [employer].

## 2019-05-05 NOTE — Progress Notes (Addendum)
Subjective:   Denise Wagner is a 85 y.o. female who presents for Medicare Annual (Subsequent) preventive examination.  Review of Systems:   Cardiac Risk Factors include: advanced age (>47men, >31 women);dyslipidemia;hypertension Sleep patterns: feels rested on waking, gets up 0 times nightly to void and sleeps 8-9 hours nightly.    Home Safety/Smoke Alarms: Feels safe in home. Smoke alarms in place.  Living environment; residence and Firearm Safety: 1-story house/ trailer, equipment: Radio producer, Type: [place]. Lives alone, no needs for DME, good support system Seat Belt Safety/Bike Helmet: Wears seat belt.     Objective:     Vitals: BP (!) 130/58   Pulse (!) 57   Temp 98.4 F (36.9 C)   Resp 17   Ht 5\' 5"  (1.651 m)   Wt 153 lb (69.4 kg)   SpO2 98%   BMI 25.46 kg/m   Body mass index is 25.46 kg/m.  Advanced Directives 05/05/2019 04/29/2018 06/17/2016 06/17/2016 06/17/2016  Does Patient Have a Medical Advance Directive? Yes Yes - Yes No  Type of Advance Directive [place];Living will [place];Living will Living will Living will -  Copy of [place] in Chart? No - copy requested No - copy requested - - -  Would patient like information on creating a medical advance directive? - - No - Patient declined - No - patient declined information    Tobacco Social History   Tobacco Use  Smoking Status Never Smoker  Smokeless Tobacco Never Used     Counseling given: Not Answered  Past Medical History:  Diagnosis Date  . Hyperlipidemia   . Hypertension   . Mild anemia   . Mild aortic stenosis   . Mild tricuspid regurgitation   . Osteopenia   . Vitamin D deficiency    Past Surgical History:  Procedure Laterality Date  . cataract surgery  2007   bilaterally  . gravida 0,para 0,mis 0, Dr.Gottsegen    . HEMORRHOID SURGERY    . no colonoscopy     "I never had a problem" ([place] reviewed)  . synvisc injections;  r knee     Dr Lynann Bologna   Family History  Problem Relation Age of Onset  . Arthritis Mother   . Heart disease Brother        MI in 72's  . Diabetes Maternal Grandmother   . Heart disease Cousin        MI  . Breast cancer Sister   . Stroke Neg Hx    Social History   Socioeconomic History  . Marital status: Widowed    Spouse name: Not on file  . Number of children: 1  . Years of education: Not on file  . Highest education level: Not on file  Occupational History  . Occupation: retired    Fish farm manager: RETIRED  Social Needs  . Financial resource strain: Not hard at all  . Food insecurity    Worry: Never true    Inability: Never true  . Transportation needs    Medical: No    Non-medical: No  Tobacco Use  . Smoking status: Never Smoker  . Smokeless tobacco: Never Used  Substance and Sexual Activity  . Alcohol use: Yes    Comment: rarely  . Drug use: No  . Sexual activity: Never  Lifestyle  . Physical activity    Days per week: 3 days    Minutes per session: 40 min  . Stress: Not at all  Relationships  . Social connections    Talks on phone: More than three times a week    Gets together: More than three times a week    Attends religious service: More than 4 times per year    Active member of club or organization: Yes    Attends meetings of clubs or organizations: More than 4 times per year    Relationship status: Widowed  Other Topics Concern  . Not on file  Social History Narrative   No diet          Outpatient Encounter Medications as of 05/05/2019  Medication Sig  . amLODipine (NORVASC) 2.5 MG tablet TAKE 1 TABLET DAILY  . aspirin 81 MG tablet Take 81 mg by mouth daily.    Marland Kitchen CALCIUM-VITAMIN D PO Take 1 tablet by mouth daily.   . cetirizine (ZYRTEC ALLERGY) 10 MG tablet Take 10 mg by mouth daily as needed for allergies.   . Cholecalciferol (VITAMIN D3) 1000 UNITS CAPS Take 1,000 mg by mouth daily.    . fluticasone (FLONASE) 50 MCG/ACT nasal spray USE 1 SPRAY  IN EACH NOSTRIL TWICE A DAY AS NEEDED FOR ALLERGIES OR RHINITIS.  Marland Kitchen Ibuprofen (ADVIL PO) Take 1 tablet by mouth daily as needed (PAIN).  Marland Kitchen L-Lysine 500 MG TABS Take 500 mg by mouth daily.    Marland Kitchen levocetirizine (XYZAL) 5 MG tablet TAKE 1 TABLET EVERY EVENING  . losartan-hydrochlorothiazide (HYZAAR) 100-25 MG tablet TAKE 1 TABLET DAILY  . metoprolol tartrate (LOPRESSOR) 25 MG tablet TAKE 1 TABLET TWICE A DAY  . Multiple Vitamins-Calcium (VIACTIV MULTI-VITAMIN PO) Take 2 tablets by mouth daily.   . Omega-3 Fatty Acids (FISH OIL) 1000 MG CAPS Take 1,000 mg by mouth 2 (two) times daily.   . vitamin C (ASCORBIC ACID) 500 MG tablet Take 500 mg by mouth daily.   . vitamin E (VITAMIN E) 400 UNIT capsule Take 400 Units by mouth daily.    . Zinc Acetate, Oral, (ZINC ACETATE PO) Take 1 tablet by mouth daily.   No facility-administered encounter medications on file as of 05/05/2019.     Activities of Daily Living In your present state of health, do you have any difficulty performing the following activities: 05/05/2019  Hearing? N  Vision? N  Difficulty concentrating or making decisions? N  Walking or climbing stairs? N  Dressing or bathing? N  Doing errands, shopping? N  Preparing Food and eating ? N  Using the Toilet? N  In the past six months, have you accidently leaked urine? N  Do you have problems with loss of bowel control? N  Managing your Medications? N  Managing your Finances? N  Housekeeping or managing your Housekeeping? N  Some recent data might be hidden    Patient Care Team: Binnie Rail, MD as PCP - General (Internal Medicine) Luberta Mutter, MD as Consulting Physician (Ophthalmology)    Assessment:   This is a routine wellness examination for Denise Wagner. Physical assessment deferred to PCP.  Exercise Activities and Dietary recommendations Current Exercise Habits: Home exercise routine, Type of exercise: walking;strength training/weights, Time (Minutes): 30, Frequency  (Times/Week): 3, Weekly Exercise (Minutes/Week): 90, Intensity: Mild, Exercise limited by: orthopedic condition(s) Diet (meal preparation, eat out, water intake, caffeinated beverages, dairy products, fruits and vegetables): in general, a "healthy" diet  , well balanced   Reviewed heart healthy diet. Encouraged patient to increase daily water and healthy fluid intake.  Goals    . Patient Stated     Continue  to stay busy, active physically and socially, stay active with church, and enjoy family and life.       Fall Risk Fall Risk  05/05/2019 12/28/2018 04/29/2018 03/25/2016 08/29/2014  Falls in the past year? 0 0 No No No  Comment - - - [place] Telephone Survey: data to providers prior to load -  Number falls in past yr: 0 0 - - -  Injury with Fall? 0 - - - -  Risk for fall due to : Impaired balance/gait;Impaired mobility - - - -   Is the patient's home free of loose throw rugs in walkways, pet beds, electrical cords, etc?   yes      Grab bars in the bathroom? yes      Handrails on the stairs?   yes      Adequate lighting?   yes  Depression Screen PHQ 2/9 Scores 05/05/2019 12/28/2018 04/29/2018 08/29/2014  PHQ - 2 Score 0 0 0 0  PHQ- 9 Score 0 - - -     Cognitive Function MMSE - Mini Mental State Exam 04/29/2018  Orientation to time 5  Orientation to Place 5  Registration 3  Attention/ Calculation 4  Recall 0  Language- name 2 objects 2  Language- repeat 1  Language- follow 3 step command 3  Language- read & follow direction 1  Write a sentence 1  Copy design 1  Total score 26     6CIT Screen 05/05/2019  What Year? 0 points  What month? 0 points  What time? 0 points  Count back from 20 0 points  Months in reverse 4 points  Repeat phrase 2 points  Total Score 6    Immunization History  Administered Date(s) Administered  . Influenza Split 05/22/2011, 04/28/2012  . Influenza Whole 05/11/2007, 04/19/2008, 07/05/2008, 05/15/2009, 04/29/2010  . Influenza, High Dose Seasonal PF  05/25/2013, 04/30/2016, 05/07/2017, 04/29/2018  . Influenza,inj,Quad PF,6+ Mos 05/05/2014, 05/10/2015  . Pneumococcal Conjugate-13 05/05/2014  . Pneumococcal Polysaccharide-23 07/29/1999  . Td 01/04/1997  . Tetanus 07/14/2012  . Zoster 06/08/2014   Screening Tests Health Maintenance  Topic Date Due  . INFLUENZA VACCINE  02/26/2019  . DEXA SCAN  12/27/2028 (Originally 05/13/1991)  . TETANUS/TDAP  07/14/2022  . PNA vac Low Risk Adult  Completed      Plan:   Reviewed health maintenance screenings with patient today and relevant education, vaccines, and/or referrals were provided.   I have personally reviewed and noted the following in the patient's chart:   . Medical and social history . Use of alcohol, tobacco or illicit drugs  . Current medications and supplements . Functional ability and status . Nutritional status . Physical activity . Advanced directives . List of other physicians . Vitals . Screenings to include cognitive, depression, and falls . Referrals and appointments  In addition, I have reviewed and discussed with patient certain preventive protocols, quality metrics, and best practice recommendations. A written personalized care plan for preventive services as well as general preventive health recommendations were provided to patient.     Michiel Cowboy, RN  05/05/2019   Medical screening examination/treatment/procedure(s) were performed by non-physician practitioner and as supervising physician I was immediately available for consultation/collaboration. I agree with above. Lew Dawes, MD

## 2019-07-05 ENCOUNTER — Other Ambulatory Visit: Payer: Self-pay | Admitting: Internal Medicine

## 2019-08-30 DIAGNOSIS — Z961 Presence of intraocular lens: Secondary | ICD-10-CM | POA: Diagnosis not present

## 2019-08-30 DIAGNOSIS — H52203 Unspecified astigmatism, bilateral: Secondary | ICD-10-CM | POA: Diagnosis not present

## 2019-09-27 ENCOUNTER — Other Ambulatory Visit: Payer: Self-pay | Admitting: Internal Medicine

## 2019-11-07 ENCOUNTER — Other Ambulatory Visit: Payer: Self-pay | Admitting: Internal Medicine

## 2019-11-07 DIAGNOSIS — I10 Essential (primary) hypertension: Secondary | ICD-10-CM

## 2019-11-22 ENCOUNTER — Telehealth: Payer: Self-pay

## 2019-11-22 MED ORDER — LEVOCETIRIZINE DIHYDROCHLORIDE 5 MG PO TABS: 5.0000 mg | ORAL_TABLET | Freq: Every evening | ORAL | 0 refills | Status: DC

## 2019-11-22 NOTE — Telephone Encounter (Signed)
Rx sent 

## 2019-11-22 NOTE — Telephone Encounter (Signed)
1.Medication Requested:levocetirizine (XYZAL) 5 MG tablet  2. Pharmacy (Name, Street, City):[facility] [place], [place]  3. On Med List: Yes   4. Last Visit with PCP: 6.2.20   5. Next visit date with PCP: no appt is made at this time    Agent: Please be advised that RX refills may take up to 3 business days. We ask that you follow-up with your pharmacy.

## 2019-11-30 DIAGNOSIS — M1711 Unilateral primary osteoarthritis, right knee: Secondary | ICD-10-CM | POA: Diagnosis not present

## 2019-12-06 DIAGNOSIS — M1711 Unilateral primary osteoarthritis, right knee: Secondary | ICD-10-CM | POA: Diagnosis not present

## 2019-12-14 DIAGNOSIS — M1711 Unilateral primary osteoarthritis, right knee: Secondary | ICD-10-CM | POA: Diagnosis not present

## 2020-02-05 ENCOUNTER — Other Ambulatory Visit: Payer: Self-pay | Admitting: Internal Medicine

## 2020-02-05 DIAGNOSIS — I10 Essential (primary) hypertension: Secondary | ICD-10-CM

## 2020-02-20 ENCOUNTER — Other Ambulatory Visit: Payer: Self-pay | Admitting: Internal Medicine

## 2020-02-27 NOTE — Progress Notes (Signed)
Subjective:    Patient ID: Denise Wagner, female    DOB: [birth year], 86 y.o.   MRN: [id number]  HPI The patient is here for an acute visit.   Blood pressure issues:  She was concerned about her BP recently.  She is taking all of her medications as prescribed.   She monitors her BP at home and it has ranged 122/61-166/68.    She was concerned about her BP because her nose started to bleed. It occurred last week. She takes 1-2 advil a day and the baby ASA and wondered it that was too much.  She has also been using flonase.   She goes to the pool up to 3/week to exercise.      Medications and allergies reviewed with patient and updated if appropriate.  Patient Active Problem List   Diagnosis Date Noted  . Allergic rhinitis 12/08/2016  . Vitamin D deficiency 10/25/2007  . Hyperlipidemia 10/25/2007  . Essential hypertension 10/25/2007  . Osteopenia 08/18/2007    Current Outpatient Medications on File Prior to Visit  Medication Sig Dispense Refill  . amLODipine (NORVASC) 2.5 MG tablet TAKE 1 TABLET DAILY 90 tablet 1  . aspirin 81 MG tablet Take 81 mg by mouth daily.      Marland Kitchen CALCIUM-VITAMIN D PO Take 1 tablet by mouth daily.     . cetirizine (ZYRTEC ALLERGY) 10 MG tablet Take 10 mg by mouth daily as needed for allergies.     . Cholecalciferol (VITAMIN D3) 1000 UNITS CAPS Take 1,000 mg by mouth daily.      . fluticasone (FLONASE) 50 MCG/ACT nasal spray USE ONE SPRAY IN EACH NOSTRIL TWICE A DAY AS NEEDED FOR ALLERGIES OR RHINITIS 48 g 3  . Ibuprofen (ADVIL PO) Take 1 tablet by mouth daily as needed (PAIN).    Marland Kitchen L-Lysine 500 MG TABS Take 500 mg by mouth daily.      Marland Kitchen levocetirizine (XYZAL) 5 MG tablet TAKE 1 TABLET EVERY EVENING 90 tablet 0  . losartan-hydrochlorothiazide (HYZAAR) 100-25 MG tablet TAKE 1 TABLET DAILY 90 tablet 3  . metoprolol tartrate (LOPRESSOR) 25 MG tablet TAKE 1 TABLET TWICE A DAY 180 tablet 3  . Multiple Vitamins-Calcium (VIACTIV MULTI-VITAMIN PO) Take 2  tablets by mouth daily.     . Omega-3 Fatty Acids (FISH OIL) 1000 MG CAPS Take 1,000 mg by mouth 2 (two) times daily.     . vitamin C (ASCORBIC ACID) 500 MG tablet Take 500 mg by mouth daily.     . vitamin E (VITAMIN E) 400 UNIT capsule Take 400 Units by mouth daily.      . Zinc Acetate, Oral, (ZINC ACETATE PO) Take 1 tablet by mouth daily.     No current facility-administered medications on file prior to visit.    Past Medical History:  Diagnosis Date  . Hyperlipidemia   . Hypertension   . Mild anemia   . Mild aortic stenosis   . Mild tricuspid regurgitation   . Osteopenia   . Vitamin D deficiency     Past Surgical History:  Procedure Laterality Date  . cataract surgery  2007   bilaterally  . gravida 0,para 0,mis 0, Dr.Gottsegen    . HEMORRHOID SURGERY    . no colonoscopy     "I never had a problem" ([place] reviewed)  . synvisc injections; r knee     Dr Lynann Bologna    Social History   Socioeconomic History  . Marital status: Widowed  Spouse name: Not on file  . Number of children: 1  . Years of education: Not on file  . Highest education level: Not on file  Occupational History  . Occupation: retired    Fish farm manager: RETIRED  Tobacco Use  . Smoking status: Never Smoker  . Smokeless tobacco: Never Used  Vaping Use  . Vaping Use: Never used  Substance and Sexual Activity  . Alcohol use: Yes    Comment: rarely  . Drug use: No  . Sexual activity: Never  Other Topics Concern  . Not on file  Social History Narrative   No diet         Social Determinants of Health   Financial Resource Strain:   . Difficulty of Paying Living Expenses:   Food Insecurity:   . Worried About Charity fundraiser in the Last Year:   . Arboriculturist in the Last Year:   Transportation Needs:   . Film/video editor (Medical):   Marland Kitchen Lack of Transportation (Non-Medical):   Physical Activity:   . Days of Exercise per Week:   . Minutes of Exercise per Session:   Stress:   . Feeling of  Stress :   Social Connections:   . Frequency of Communication with Friends and Family:   . Frequency of Social Gatherings with Friends and Family:   . Attends Religious Services:   . Active Member of Clubs or Organizations:   . Attends Archivist Meetings:   Marland Kitchen Marital Status:     Family History  Problem Relation Age of Onset  . Arthritis Mother   . Heart disease Brother        MI in 79's  . Diabetes Maternal Grandmother   . Heart disease Cousin        MI  . Breast cancer Sister   . Stroke Neg Hx     Review of Systems  Constitutional: Negative for fever.  HENT: Positive for nosebleeds. Negative for congestion and sinus pain.   Respiratory: Positive for shortness of breath (a little). Negative for cough and wheezing.   Cardiovascular: Positive for leg swelling (LLE > RLE ). Negative for chest pain and palpitations.  Neurological: Positive for headaches (only occ). Negative for dizziness.  Psychiatric/Behavioral: Negative for sleep disturbance.       Objective:   Vitals:   02/28/20 1208  BP: 134/78  Pulse: (!) 55  Temp: 98.2 F (36.8 C)  SpO2: 98%   BP Readings from Last 3 Encounters:  02/28/20 134/78  05/05/19 (!) 130/58  12/28/18 (!) 146/60   Wt Readings from Last 3 Encounters:  02/28/20 151 lb (68.5 kg)  05/05/19 153 lb (69.4 kg)  12/28/18 152 lb (68.9 kg)   Body mass index is 25.13 kg/m.   Physical Exam    Constitutional: Appears well-developed and well-nourished. No distress.  Head: Normocephalic and atraumatic.  Neck: Neck supple. No tracheal deviation present. No thyromegaly present.  No cervical lymphadenopathy Cardiovascular: Normal rate, regular rhythm and normal heart sounds.  2/6 sys murmur heard. No carotid bruit .  1 + LLE edema > RLE edema - no pitting Pulmonary/Chest: Effort normal and breath sounds normal. No respiratory distress. No has no wheezes. No rales.  Skin: Skin is warm and dry. Not diaphoretic.  Psychiatric: Normal mood  and affect. Behavior is normal.       Assessment & Plan:    See Problem List for Assessment and Plan of chronic medical problems.  This visit occurred during the SARS-CoV-2 public health emergency.  Safety protocols were in place, including screening questions prior to the visit, additional usage of staff PPE, and extensive cleaning of exam room while observing appropriate contact time as indicated for disinfecting solutions.

## 2020-02-27 NOTE — Patient Instructions (Addendum)
To prevent the nose bleeds you can use nasal saline spray.  Avoid using the steroid nasal spray too much.      Take tylenol for your arthritis.  You can take up to 3000mg  in one day.  Take the advil when you really need it.     Blood work was ordered.     Medications reviewed and updated.  Changes include :   none    Please followup in one year, sooner if needed

## 2020-02-28 ENCOUNTER — Ambulatory Visit (INDEPENDENT_AMBULATORY_CARE_PROVIDER_SITE_OTHER): Payer: Medicare Other | Admitting: Internal Medicine

## 2020-02-28 ENCOUNTER — Encounter: Payer: Self-pay | Admitting: Internal Medicine

## 2020-02-28 ENCOUNTER — Other Ambulatory Visit: Payer: Self-pay

## 2020-02-28 VITALS — BP 134/78 | HR 55 | Temp 98.2°F | Ht 65.0 in | Wt 151.0 lb

## 2020-02-28 DIAGNOSIS — R040 Epistaxis: Secondary | ICD-10-CM | POA: Insufficient documentation

## 2020-02-28 DIAGNOSIS — I10 Essential (primary) hypertension: Secondary | ICD-10-CM | POA: Diagnosis not present

## 2020-02-28 LAB — COMPLETE METABOLIC PANEL WITH GFR
AG Ratio: 1.8 (calc) (ref 1.0–2.5)
ALT: 11 U/L (ref 6–29)
AST: 16 U/L (ref 10–35)
Albumin: 4.1 g/dL (ref 3.6–5.1)
Alkaline phosphatase (APISO): 43 U/L (ref 37–153)
BUN/Creatinine Ratio: 21 (calc) (ref 6–22)
BUN: 20 mg/dL (ref 7–25)
CO2: 28 mmol/L (ref 20–32)
Calcium: 10.1 mg/dL (ref 8.6–10.4)
Chloride: 93 mmol/L — ABNORMAL LOW (ref 98–110)
Creat: 0.94 mg/dL — ABNORMAL HIGH (ref 0.60–0.88)
GFR, Est African American: 61 mL/min/{1.73_m2} (ref >=60)
GFR, Est Non African American: 52 mL/min/{1.73_m2} — ABNORMAL LOW (ref >=60)
Globulin: 2.3 g/dL (calc) (ref 1.9–3.7)
Glucose, Bld: 104 mg/dL — ABNORMAL HIGH (ref 65–99)
Potassium: 4.3 mmol/L (ref 3.5–5.3)
Sodium: 128 mmol/L — ABNORMAL LOW (ref 135–146)
Total Bilirubin: 0.6 mg/dL (ref 0.2–1.2)
Total Protein: 6.4 g/dL (ref 6.1–8.1)

## 2020-02-28 LAB — CBC WITH DIFFERENTIAL/PLATELET
Absolute Monocytes: 728 cells/uL (ref 200–950)
Basophils Absolute: 39 cells/uL (ref 0–200)
Basophils Relative: 0.4 %
Eosinophils Absolute: 107 cells/uL (ref 15–500)
Eosinophils Relative: 1.1 %
HCT: 38.5 % (ref 35.0–45.0)
Hemoglobin: 13.2 g/dL (ref 11.7–15.5)
Lymphs Abs: 3667 cells/uL (ref 850–3900)
MCH: 31.8 pg (ref 27.0–33.0)
MCHC: 34.3 g/dL (ref 32.0–36.0)
MCV: 92.8 fL (ref 80.0–100.0)
MPV: 9.5 fL (ref 7.5–12.5)
Monocytes Relative: 7.5 %
Neutro Abs: 5160 cells/uL (ref 1500–7800)
Neutrophils Relative %: 53.2 %
Platelets: 198 10*3/uL (ref 140–400)
RBC: 4.15 10*6/uL (ref 3.80–5.10)
RDW: 11.9 % (ref 11.0–15.0)
Total Lymphocyte: 37.8 %
WBC: 9.7 10*3/uL (ref 3.8–10.8)

## 2020-02-28 NOTE — Assessment & Plan Note (Addendum)
Acute A couple of episodes- bleeding stopped quickly Likely related to using Flonase, possibly Advil and baby aspirin contributed Advise discontinuing Flonase-use saline nasal spray Keep Advil to a minimum-can take Tylenol and take Advil only when pain is severe Can use saline nasal spray for prevention and continue nasal gel, which will help moisturize the area Call with any concerns We will check CBC

## 2020-02-28 NOTE — Assessment & Plan Note (Signed)
Chronic BP well controlled here and overall controlled at home  Current regimen effective and well tolerated Continue current medications at current doses cmp

## 2020-03-25 ENCOUNTER — Other Ambulatory Visit: Payer: Self-pay | Admitting: Internal Medicine

## 2020-04-06 ENCOUNTER — Other Ambulatory Visit: Payer: Self-pay | Admitting: Internal Medicine

## 2020-05-10 ENCOUNTER — Ambulatory Visit (INDEPENDENT_AMBULATORY_CARE_PROVIDER_SITE_OTHER): Payer: Medicare Other

## 2020-05-10 ENCOUNTER — Other Ambulatory Visit: Payer: Self-pay

## 2020-05-10 DIAGNOSIS — Z0000 Encounter for general adult medical examination without abnormal findings: Secondary | ICD-10-CM

## 2020-05-10 DIAGNOSIS — Z23 Encounter for immunization: Secondary | ICD-10-CM

## 2020-05-10 NOTE — Progress Notes (Signed)
Subjective:   Denise Wagner is a 86 y.o. female who presents for Medicare Annual (Subsequent) preventive examination.  Review of Systems    No ROS. Medicare Wellness Visit. Additional risk factors are reflected in social history. Cardiac Risk Factors include: advanced age (>4men, >37 women);family history of premature cardiovascular disease;dyslipidemia;hypertension Sleep Patterns: No sleep issues, feels rested on waking and sleeps 8 hours nightly. Home Safety/Smoke Alarms: Feels safe in home; uses home alarm. Smoke alarms in place. Living environment: 1-story home; Lives alone; no needs for DME; good support system. Seat Belt Safety/Bike Helmet: Wears seat belt.    Objective:    Today's Vitals   05/10/20 1300  BP: 130/70  Pulse: 62  Resp: 16  Temp: (!) 97.5 F (36.4 C)  SpO2: 98%  Weight: 155 lb 3.2 oz (70.4 kg)  Height: 5\' 5"  (1.651 m)  PainSc: 0-No pain   Body mass index is 25.83 kg/m.  Advanced Directives 05/10/2020 05/05/2019 04/29/2018 06/17/2016 06/17/2016 06/17/2016  Does Patient Have a Medical Advance Directive? Yes Yes Yes - Yes No  Type of Advance Directive [place];Living will [place];Living will [place];Living will Living will Living will -  Does patient want to make changes to medical advance directive? No - Patient declined - - - - -  Copy of [place] in Chart? No - copy requested No - copy requested No - copy requested - - -  Would patient like information on creating a medical advance directive? - - - No - Patient declined - No - patient declined information    Current Medications (verified) Outpatient Encounter Medications as of 05/10/2020  Medication Sig  . amLODipine (NORVASC) 2.5 MG tablet TAKE 1 TABLET DAILY  . aspirin 81 MG tablet Take 81 mg by mouth daily.    Marland Kitchen CALCIUM-VITAMIN D PO Take 1 tablet by mouth daily.   . cetirizine (ZYRTEC ALLERGY) 10 MG tablet Take  10 mg by mouth daily as needed for allergies.   . Cholecalciferol (VITAMIN D3) 1000 UNITS CAPS Take 1,000 mg by mouth daily.    . fluticasone (FLONASE) 50 MCG/ACT nasal spray USE ONE SPRAY IN EACH NOSTRIL TWICE A DAY AS NEEDED FOR ALLERGIES OR RHINITIS  . Ibuprofen (ADVIL PO) Take 1 tablet by mouth daily as needed (PAIN).  Marland Kitchen L-Lysine 500 MG TABS Take 500 mg by mouth daily.    Marland Kitchen levocetirizine (XYZAL) 5 MG tablet TAKE 1 TABLET EVERY EVENING (Patient not taking: Reported on 05/10/2020)  . losartan-hydrochlorothiazide (HYZAAR) 100-25 MG tablet TAKE 1 TABLET DAILY  . metoprolol tartrate (LOPRESSOR) 25 MG tablet TAKE 1 TABLET TWICE A DAY  . Multiple Vitamins-Calcium (VIACTIV MULTI-VITAMIN PO) Take 2 tablets by mouth daily.   . Omega-3 Fatty Acids (FISH OIL) 1000 MG CAPS Take 1,000 mg by mouth 2 (two) times daily.   . vitamin C (ASCORBIC ACID) 500 MG tablet Take 500 mg by mouth daily.   . vitamin E (VITAMIN E) 400 UNIT capsule Take 400 Units by mouth daily.    . Zinc Acetate, Oral, (ZINC ACETATE PO) Take 1 tablet by mouth daily.   No facility-administered encounter medications on file as of 05/10/2020.    Allergies (verified) Doxycycline, Metronidazole, Penicillins, Propoxyphene n-acetaminophen, Sulfonamide derivatives, and Pravastatin   History: Past Medical History:  Diagnosis Date  . Hyperlipidemia   . Hypertension   . Mild anemia   . Mild aortic stenosis   . Mild tricuspid regurgitation   .  Osteopenia   . Vitamin D deficiency    Past Surgical History:  Procedure Laterality Date  . cataract surgery  2007   bilaterally  . gravida 0,para 0,mis 0, Dr.Gottsegen    . HEMORRHOID SURGERY    . no colonoscopy     "I never had a problem" ([place] reviewed)  . synvisc injections; r knee     Dr Lynann Bologna   Family History  Problem Relation Age of Onset  . Arthritis Mother   . Heart disease Brother        MI in 38's  . Diabetes Maternal Grandmother   . Heart disease Cousin        MI  .  Breast cancer Sister   . Stroke Neg Hx    Social History   Socioeconomic History  . Marital status: Widowed    Spouse name: Not on file  . Number of children: 1  . Years of education: Not on file  . Highest education level: Not on file  Occupational History  . Occupation: retired    Fish farm manager: RETIRED  Tobacco Use  . Smoking status: Never Smoker  . Smokeless tobacco: Never Used  Vaping Use  . Vaping Use: Never used  Substance and Sexual Activity  . Alcohol use: Yes    Comment: rarely  . Drug use: No  . Sexual activity: Never  Other Topics Concern  . Not on file  Social History Narrative   No diet         Social Determinants of Health   Financial Resource Strain: Low Risk   . Difficulty of Paying Living Expenses: Not hard at all  Food Insecurity: No Food Insecurity  . Worried About Charity fundraiser in the Last Year: Never true  . Ran Out of Food in the Last Year: Never true  Transportation Needs: No Transportation Needs  . Lack of Transportation (Medical): No  . Lack of Transportation (Non-Medical): No  Physical Activity: Sufficiently Active  . Days of Exercise per Week: 5 days  . Minutes of Exercise per Session: 30 min  Stress: No Stress Concern Present  . Feeling of Stress : Not at all  Social Connections: Moderately Integrated  . Frequency of Communication with Friends and Family: More than three times a week  . Frequency of Social Gatherings with Friends and Family: More than three times a week  . Attends Religious Services: More than 4 times per year  . Active Member of Clubs or Organizations: Yes  . Attends Archivist Meetings: More than 4 times per year  . Marital Status: Widowed    Tobacco Counseling Counseling given: Not Answered   Clinical Intake:     Pain : No/denies pain Pain Score: 0-No pain     BMI - recorded: 25.83 Nutritional Status: BMI 25 -29 Overweight Nutritional Risks: None Diabetes: No  How often do you need to  have someone help you when you read instructions, pamphlets, or other written materials from your doctor or pharmacy?: 1 - Never What is the last grade level you completed in school?: HSG  Diabetic? no  Interpreter Needed?: No  Information entered by :: Bindu Docter N. Tiffney Haughton, LPN   Activities of Daily Living In your present state of health, do you have any difficulty performing the following activities: 05/10/2020  Hearing? N  Vision? N  Difficulty concentrating or making decisions? N  Walking or climbing stairs? N  Dressing or bathing? N  Doing errands, shopping? N  Preparing Food  and eating ? N  Using the Toilet? N  In the past six months, have you accidently leaked urine? N  Do you have problems with loss of bowel control? N  Managing your Medications? N  Managing your Finances? N  Housekeeping or managing your Housekeeping? N  Some recent data might be hidden    Patient Care Team: Binnie Rail, MD as PCP - General (Internal Medicine) Luberta Mutter, MD as Consulting Physician (Ophthalmology)  Indicate any recent Medical Services you may have received from other than [facility] providers in the past year (date may be approximate).     Assessment:   This is a routine wellness examination for Raesha.  Hearing/Vision screen No exam data present  Dietary issues and exercise activities discussed: Current Exercise Habits: Home exercise routine, Type of exercise: walking;Other - see comments (lives alone and does her own yard work, house work, Social research officer, government.), Time (Minutes): 30, Frequency (Times/Week): 5, Weekly Exercise (Minutes/Week): 150, Intensity: Moderate, Exercise limited by: None identified  Goals    . Patient Stated     Continue to stay busy, active physically and socially, stay active with church, and enjoy family and life.      Depression Screen PHQ 2/9 Scores 05/10/2020 05/05/2019 12/28/2018 04/29/2018 08/29/2014 08/26/2013 07/14/2012  PHQ - 2 Score 0 0 0 0 0 0 0  PHQ- 9  Score - 0 - - - - -    Fall Risk Fall Risk  05/10/2020 05/05/2019 12/28/2018 04/29/2018 03/25/2016  Falls in the past year? 0 0 0 No No  Comment - - - - Emmi Telephone Survey: data to providers prior to load  Number falls in past yr: 0 0 0 - -  Injury with Fall? 0 0 - - -  Risk for fall due to : No Fall Risks Impaired balance/gait;Impaired mobility - - -  Follow up Falls evaluation completed;Education provided - - - -    Any stairs in or around the home? No  If so, are there any without handrails? No  Home free of loose throw rugs in walkways, pet beds, electrical cords, etc? Yes  Adequate lighting in your home to reduce risk of falls? Yes   ASSISTIVE DEVICES UTILIZED TO PREVENT FALLS:  Life alert? No  Use of a cane, walker or w/c? Yes  (has a cane, but does not use it) Grab bars in the bathroom? Yes  Shower chair or bench in shower? No  Elevated toilet seat or a handicapped toilet? No   TIMED UP AND GO:  Was the test performed? No .  Length of time to ambulate 10 feet: 0 sec.   Gait steady and fast without use of assistive device  Cognitive Function: MMSE - Mini Mental State Exam 04/29/2018  Orientation to time 5  Orientation to Place 5  Registration 3  Attention/ Calculation 4  Recall 0  Language- name 2 objects 2  Language- repeat 1  Language- follow 3 step command 3  Language- read & follow direction 1  Write a sentence 1  Copy design 1  Total score 26     6CIT Screen 05/10/2020 05/05/2019  What Year? 0 points 0 points  What month? 0 points 0 points  What time? 0 points 0 points  Count back from 20 0 points 0 points  Months in reverse 0 points 4 points  Repeat phrase 0 points 2 points  Total Score 0 6    Immunizations Immunization History  Administered Date(s) Administered  . Fluad  Quad(high Dose 65+) 05/05/2019  . Influenza Split 05/22/2011, 04/28/2012  . Influenza Whole 05/11/2007, 04/19/2008, 07/05/2008, 05/15/2009, 04/29/2010  . Influenza, High Dose  Seasonal PF 05/25/2013, 04/30/2016, 05/07/2017, 04/29/2018  . Influenza,inj,Quad PF,6+ Mos 05/05/2014, 05/10/2015  . PFIZER SARS-COV-2 Vaccination 08/12/2019, 09/02/2019  . Pneumococcal Conjugate-13 05/05/2014  . Pneumococcal Polysaccharide-23 07/29/1999  . Td 01/04/1997  . Tetanus 07/14/2012  . Zoster 06/08/2014    TDAP status: Up to date Flu Vaccine status: Up to date Pneumococcal vaccine status: Up to date Covid-19 vaccine status: Completed vaccines  Qualifies for Shingles Vaccine? Yes   Zostavax completed Yes   Shingrix Completed?: No.    Education has been provided regarding the importance of this vaccine. Patient has been advised to call insurance company to determine out of pocket expense if they have not yet received this vaccine. Advised may also receive vaccine at local pharmacy or Health Dept. Verbalized acceptance and understanding.  Screening Tests Health Maintenance  Topic Date Due  . INFLUENZA VACCINE  02/26/2020  . DEXA SCAN  12/27/2028 (Originally 05/13/1991)  . TETANUS/TDAP  07/14/2022  . COVID-19 Vaccine  Completed  . PNA vac Low Risk Adult  Completed    Health Maintenance  Health Maintenance Due  Topic Date Due  . INFLUENZA VACCINE  02/26/2020    Colorectal cancer screening: No longer required.  Mammogram status: No longer required.  Bone density status: never done  Lung Cancer Screening: (Low Dose CT Chest recommended if Age 83-80 years, 30 pack-year currently smoking OR have quit w/in 15years.) does not qualify.   Lung Cancer Screening Referral: no  Additional Screening:  Hepatitis C Screening: does not qualify; Completed no  Vision Screening: Recommended annual ophthalmology exams for early detection of glaucoma and other disorders of the eye. Is the patient up to date with their annual eye exam?  Yes  Who is the provider or what is the name of the office in which the patient attends annual eye exams? Luberta Mutter, MD If pt is not  established with a provider, would they like to be referred to a provider to establish care? No .   Dental Screening: Recommended annual dental exams for proper oral hygiene  Community Resource Referral / Chronic Care Management: CRR required this visit?  No   CCM required this visit?  No      Plan:     I have personally reviewed and noted the following in the patient's chart:   . Medical and social history . Use of alcohol, tobacco or illicit drugs  . Current medications and supplements . Functional ability and status . Nutritional status . Physical activity . Advanced directives . List of other physicians . Hospitalizations, surgeries, and ER visits in previous 12 months . Vitals . Screenings to include cognitive, depression, and falls . Referrals and appointments  In addition, I have reviewed and discussed with patient certain preventive protocols, quality metrics, and best practice recommendations. A written personalized care plan for preventive services as well as general preventive health recommendations were provided to patient.     Sheral Flow, LPN   [id number]   Nurse Notes: n/a

## 2020-05-10 NOTE — Patient Instructions (Signed)
Denise Wagner , Thank you for taking time to come for your Medicare Wellness Visit. I appreciate your ongoing commitment to your health goals. Please review the following plan we discussed and let me know if I can assist you in the future.   Screening recommendations/referrals: Colonoscopy: no repeat due to age 86: no repeat due to age Bone Density: never done Recommended yearly ophthalmology/optometry visit for glaucoma screening and checkup Recommended yearly dental visit for hygiene and checkup  Vaccinations: Influenza vaccine: 05/10/2020 Pneumococcal vaccine: up to date Tdap vaccine: 04/22/2019; Shingles vaccine: Zoster  Covid-19: completed  Advanced directives: Please bring a copy of your health care power of attorney and living will to the office at your convenience.  Conditions/risks identified: Yes; Reviewed health maintenance screenings with patient today and relevant education, vaccines, and/or referrals were provided. Please continue to do your personal lifestyle choices by: daily care of teeth and gums, regular physical activity (goal should be 5 days a week for 30 minutes), eat a healthy diet, avoid tobacco and drug use, limiting any alcohol intake, taking a low-dose aspirin (if not allergic or have been advised by your provider otherwise) and taking vitamins and minerals as recommended by your provider. Continue doing brain stimulating activities (puzzles, reading, adult coloring books, staying active) to keep memory sharp. Continue to eat heart healthy diet (full of fruits, vegetables, whole grains, lean protein, water--limit salt, fat, and sugar intake) and increase physical activity as tolerated.  Next appointment: Please schedule your next Medicare Wellness Visit with your Nurse Health Advisor in 1 year.  Preventive Care 56 Years and Older, Female Preventive care refers to lifestyle choices and visits with your health care provider that can promote health and  wellness. What does preventive care include?  A yearly physical exam. This is also called an annual well check.  Dental exams once or twice a year.  Routine eye exams. Ask your health care provider how often you should have your eyes checked.  Personal lifestyle choices, including:  Daily care of your teeth and gums.  Regular physical activity.  Eating a healthy diet.  Avoiding tobacco and drug use.  Limiting alcohol use.  Practicing safe sex.  Taking low-dose aspirin every day.  Taking vitamin and mineral supplements as recommended by your health care provider. What happens during an annual well check? The services and screenings done by your health care provider during your annual well check will depend on your age, overall health, lifestyle risk factors, and family history of disease. Counseling  Your health care provider may ask you questions about your:  Alcohol use.  Tobacco use.  Drug use.  Emotional well-being.  Home and relationship well-being.  Sexual activity.  Eating habits.  History of falls.  Memory and ability to understand (cognition).  Work and work Statistician.  Reproductive health. Screening  You may have the following tests or measurements:  Height, weight, and BMI.  Blood pressure.  Lipid and cholesterol levels. These may be checked every 5 years, or more frequently if you are over 25 years old.  Skin check.  Lung cancer screening. You may have this screening every year starting at age 50 if you have a 30-pack-year history of smoking and currently smoke or have quit within the past 15 years.  Fecal occult blood test (FOBT) of the stool. You may have this test every year starting at age 23.  Flexible sigmoidoscopy or colonoscopy. You may have a sigmoidoscopy every 5 years or a colonoscopy every 10 years  starting at age 23.  Hepatitis C blood test.  Hepatitis B blood test.  Sexually transmitted disease (STD)  testing.  Diabetes screening. This is done by checking your blood sugar (glucose) after you have not eaten for a while (fasting). You may have this done every 1-3 years.  Bone density scan. This is done to screen for osteoporosis. You may have this done starting at age 88.  Mammogram. This may be done every 1-2 years. Talk to your health care provider about how often you should have regular mammograms. Talk with your health care provider about your test results, treatment options, and if necessary, the need for more tests. Vaccines  Your health care provider may recommend certain vaccines, such as:  Influenza vaccine. This is recommended every year.  Tetanus, diphtheria, and acellular pertussis (Tdap, Td) vaccine. You may need a Td booster every 10 years.  Zoster vaccine. You may need this after age 65.  Pneumococcal 13-valent conjugate (PCV13) vaccine. One dose is recommended after age 22.  Pneumococcal polysaccharide (PPSV23) vaccine. One dose is recommended after age 32. Talk to your health care provider about which screenings and vaccines you need and how often you need them. This information is not intended to replace advice given to you by your health care provider. Make sure you discuss any questions you have with your health care provider. Document Released: 08/10/2015 Document Revised: 04/02/2016 Document Reviewed: 05/15/2015 Elsevier Interactive Patient Education  2017 [place] Prevention in the Home Falls can cause injuries. They can happen to people of all ages. There are many things you can do to make your home safe and to help prevent falls. What can I do on the outside of my home?  Regularly fix the edges of walkways and driveways and fix any cracks.  Remove anything that might make you trip as you walk through a door, such as a raised step or threshold.  Trim any bushes or trees on the path to your home.  Use bright outdoor lighting.  Clear any walking  paths of anything that might make someone trip, such as rocks or tools.  Regularly check to see if handrails are loose or broken. Make sure that both sides of any steps have handrails.  Any raised decks and porches should have guardrails on the edges.  Have any leaves, snow, or ice cleared regularly.  Use sand or salt on walking paths during winter.  Clean up any spills in your garage right away. This includes oil or grease spills. What can I do in the bathroom?  Use night lights.  Install grab bars by the toilet and in the tub and shower. Do not use towel bars as grab bars.  Use non-skid mats or decals in the tub or shower.  If you need to sit down in the shower, use a plastic, non-slip stool.  Keep the floor dry. Clean up any water that spills on the floor as soon as it happens.  Remove soap buildup in the tub or shower regularly.  Attach bath mats securely with double-sided non-slip rug tape.  Do not have throw rugs and other things on the floor that can make you trip. What can I do in the bedroom?  Use night lights.  Make sure that you have a light by your bed that is easy to reach.  Do not use any sheets or blankets that are too big for your bed. They should not hang down onto the floor.  Have a  firm chair that has side arms. You can use this for support while you get dressed.  Do not have throw rugs and other things on the floor that can make you trip. What can I do in the kitchen?  Clean up any spills right away.  Avoid walking on wet floors.  Keep items that you use a lot in easy-to-reach places.  If you need to reach something above you, use a strong step stool that has a grab bar.  Keep electrical cords out of the way.  Do not use floor polish or wax that makes floors slippery. If you must use wax, use non-skid floor wax.  Do not have throw rugs and other things on the floor that can make you trip. What can I do with my stairs?  Do not leave any items  on the stairs.  Make sure that there are handrails on both sides of the stairs and use them. Fix handrails that are broken or loose. Make sure that handrails are as long as the stairways.  Check any carpeting to make sure that it is firmly attached to the stairs. Fix any carpet that is loose or worn.  Avoid having throw rugs at the top or bottom of the stairs. If you do have throw rugs, attach them to the floor with carpet tape.  Make sure that you have a light switch at the top of the stairs and the bottom of the stairs. If you do not have them, ask someone to add them for you. What else can I do to help prevent falls?  Wear shoes that:  Do not have high heels.  Have rubber bottoms.  Are comfortable and fit you well.  Are closed at the toe. Do not wear sandals.  If you use a stepladder:  Make sure that it is fully opened. Do not climb a closed stepladder.  Make sure that both sides of the stepladder are locked into place.  Ask someone to hold it for you, if possible.  Clearly mark and make sure that you can see:  Any grab bars or handrails.  First and last steps.  Where the edge of each step is.  Use tools that help you move around (mobility aids) if they are needed. These include:  Canes.  Walkers.  Scooters.  Crutches.  Turn on the lights when you go into a dark area. Replace any light bulbs as soon as they burn out.  Set up your furniture so you have a clear path. Avoid moving your furniture around.  If any of your floors are uneven, fix them.  If there are any pets around you, be aware of where they are.  Review your medicines with your doctor. Some medicines can make you feel dizzy. This can increase your chance of falling. Ask your doctor what other things that you can do to help prevent falls. This information is not intended to replace advice given to you by your health care provider. Make sure you discuss any questions you have with your health care  provider. Document Released: 05/10/2009 Document Revised: 12/20/2015 Document Reviewed: 08/18/2014 Elsevier Interactive Patient Education  2017 [employer].

## 2020-06-04 NOTE — Progress Notes (Signed)
Subjective:    Patient ID: Denise Wagner, female    DOB: 15-Mar-1924, 86 y.o.   MRN: [id number]  HPI The patient is here for follow up of their chronic medical problems, including htn, allergies, osteopenia, vitamin d def, hyperlipidemia  She is taking all of her medications as prescribed.    She cooks some, goes out some.  She does not eat frozen meals.    Usually swims in summer.  Limited in walking due to knee pain.    Medications and allergies reviewed with patient and updated if appropriate.  Patient Active Problem List   Diagnosis Date Noted  . Nosebleed 02/28/2020  . Allergic rhinitis 12/08/2016  . Vitamin D deficiency 10/25/2007  . Hyperlipidemia 10/25/2007  . Essential hypertension 10/25/2007  . Osteopenia 08/18/2007    Current Outpatient Medications on File Prior to Visit  Medication Sig Dispense Refill  . amLODipine (NORVASC) 2.5 MG tablet TAKE 1 TABLET DAILY 90 tablet 3  . aspirin 81 MG tablet Take 81 mg by mouth daily.      Marland Kitchen CALCIUM-VITAMIN D PO Take 1 tablet by mouth daily.     . cetirizine (ZYRTEC ALLERGY) 10 MG tablet Take 10 mg by mouth daily as needed for allergies.     . Cholecalciferol (VITAMIN D3) 1000 UNITS CAPS Take 1,000 mg by mouth daily.      . Ibuprofen (ADVIL PO) Take 1 tablet by mouth daily as needed (PAIN).    Marland Kitchen L-Lysine 500 MG TABS Take 500 mg by mouth daily.      Marland Kitchen losartan-hydrochlorothiazide (HYZAAR) 100-25 MG tablet TAKE 1 TABLET DAILY 90 tablet 3  . metoprolol tartrate (LOPRESSOR) 25 MG tablet TAKE 1 TABLET TWICE A DAY 180 tablet 3  . Multiple Vitamins-Calcium (VIACTIV MULTI-VITAMIN PO) Take 2 tablets by mouth daily.     . Omega-3 Fatty Acids (FISH OIL) 1000 MG CAPS Take 1,000 mg by mouth 2 (two) times daily.     . vitamin C (ASCORBIC ACID) 500 MG tablet Take 500 mg by mouth daily.     . vitamin E (VITAMIN E) 400 UNIT capsule Take 400 Units by mouth daily.      . Zinc Acetate, Oral, (ZINC ACETATE PO) Take 1 tablet by mouth daily.       No current facility-administered medications on file prior to visit.    Past Medical History:  Diagnosis Date  . Hyperlipidemia   . Hypertension   . Mild anemia   . Mild aortic stenosis   . Mild tricuspid regurgitation   . Osteopenia   . Vitamin D deficiency     Past Surgical History:  Procedure Laterality Date  . cataract surgery  2007   bilaterally  . gravida 0,para 0,mis 0, Dr.Gottsegen    . HEMORRHOID SURGERY    . no colonoscopy     "I never had a problem" ([place] reviewed)  . synvisc injections; r knee     Dr Lynann Bologna    Social History   Socioeconomic History  . Marital status: Widowed    Spouse name: Not on file  . Number of children: 1  . Years of education: Not on file  . Highest education level: Not on file  Occupational History  . Occupation: retired    Fish farm manager: RETIRED  Tobacco Use  . Smoking status: Never Smoker  . Smokeless tobacco: Never Used  Vaping Use  . Vaping Use: Never used  Substance and Sexual Activity  . Alcohol use: Yes  Comment: rarely  . Drug use: No  . Sexual activity: Never  Other Topics Concern  . Not on file  Social History Narrative   No diet         Social Determinants of Health   Financial Resource Strain: Low Risk   . Difficulty of Paying Living Expenses: Not hard at all  Food Insecurity: No Food Insecurity  . Worried About Charity fundraiser in the Last Year: Never true  . Ran Out of Food in the Last Year: Never true  Transportation Needs: No Transportation Needs  . Lack of Transportation (Medical): No  . Lack of Transportation (Non-Medical): No  Physical Activity: Sufficiently Active  . Days of Exercise per Week: 5 days  . Minutes of Exercise per Session: 30 min  Stress: No Stress Concern Present  . Feeling of Stress : Not at all  Social Connections: Moderately Integrated  . Frequency of Communication with Friends and Family: More than three times a week  . Frequency of Social Gatherings with Friends and  Family: More than three times a week  . Attends Religious Services: More than 4 times per year  . Active Member of Clubs or Organizations: Yes  . Attends Archivist Meetings: More than 4 times per year  . Marital Status: Widowed    Family History  Problem Relation Age of Onset  . Arthritis Mother   . Heart disease Brother        MI in 52's  . Diabetes Maternal Grandmother   . Heart disease Cousin        MI  . Breast cancer Sister   . Stroke Neg Hx     Review of Systems  Constitutional: Negative for fever.  HENT: Positive for postnasal drip.   Respiratory: Positive for cough (PND). Negative for shortness of breath and wheezing.   Cardiovascular: Positive for leg swelling. Negative for chest pain and palpitations.  Gastrointestinal: Negative for abdominal pain, constipation, diarrhea and nausea.  Musculoskeletal: Positive for arthralgias (knees). Negative for back pain.  Neurological: Negative for light-headedness and headaches.       Objective:   Vitals:   06/05/20 1300  BP: 134/68  Pulse: (!) 58  Temp: 97.9 F (36.6 C)  SpO2: 96%   BP Readings from Last 3 Encounters:  06/05/20 134/68  05/10/20 130/70  02/28/20 134/78   Wt Readings from Last 3 Encounters:  06/05/20 154 lb (69.9 kg)  05/10/20 155 lb 3.2 oz (70.4 kg)  02/28/20 151 lb (68.5 kg)   Body mass index is 25.63 kg/m.   Physical Exam    Constitutional: Appears well-developed and well-nourished. No distress.  HENT:  Head: Normocephalic and atraumatic.  Neck: Neck supple. No tracheal deviation present. No thyromegaly present.  No cervical lymphadenopathy Cardiovascular: Normal rate, regular rhythm and normal heart sounds.   2/6 systolic murmur heard. No carotid bruit .  1 + swelling LLE > RLE edema Pulmonary/Chest: Effort normal and breath sounds normal. No respiratory distress. No has no wheezes. No rales.  Skin: Skin is warm and dry. Not diaphoretic.  Psychiatric: Normal mood and affect.  Behavior is normal.      Assessment & Plan:    See Problem List for Assessment and Plan of chronic medical problems.    This visit occurred during the SARS-CoV-2 public health emergency.  Safety protocols were in place, including screening questions prior to the visit, additional usage of staff PPE, and extensive cleaning of exam room while observing appropriate  contact time as indicated for disinfecting solutions.

## 2020-06-05 ENCOUNTER — Ambulatory Visit (INDEPENDENT_AMBULATORY_CARE_PROVIDER_SITE_OTHER): Payer: Medicare Other | Admitting: Internal Medicine

## 2020-06-05 ENCOUNTER — Encounter: Payer: Self-pay | Admitting: Internal Medicine

## 2020-06-05 ENCOUNTER — Other Ambulatory Visit: Payer: Self-pay

## 2020-06-05 VITALS — BP 134/68 | HR 58 | Temp 97.9°F | Ht 65.0 in | Wt 154.0 lb

## 2020-06-05 DIAGNOSIS — M8589 Other specified disorders of bone density and structure, multiple sites: Secondary | ICD-10-CM

## 2020-06-05 DIAGNOSIS — M170 Bilateral primary osteoarthritis of knee: Secondary | ICD-10-CM | POA: Diagnosis not present

## 2020-06-05 DIAGNOSIS — J301 Allergic rhinitis due to pollen: Secondary | ICD-10-CM

## 2020-06-05 DIAGNOSIS — E559 Vitamin D deficiency, unspecified: Secondary | ICD-10-CM

## 2020-06-05 DIAGNOSIS — I10 Essential (primary) hypertension: Secondary | ICD-10-CM

## 2020-06-05 DIAGNOSIS — D692 Other nonthrombocytopenic purpura: Secondary | ICD-10-CM | POA: Diagnosis not present

## 2020-06-05 DIAGNOSIS — E7849 Other hyperlipidemia: Secondary | ICD-10-CM | POA: Diagnosis not present

## 2020-06-05 NOTE — Assessment & Plan Note (Signed)
Taking calcium and vitamin d Exercising minimally Deferred dexa

## 2020-06-05 NOTE — Assessment & Plan Note (Addendum)
Chronic Has a few small bruises on b/l arms - always related to mild trauma No concerning bruising

## 2020-06-05 NOTE — Assessment & Plan Note (Signed)
Chronic BP well controlled Continue amlodipine 2.5 mg daily, losartan-hctz 100-25 mg daily, metoprolol 25 mg BID

## 2020-06-05 NOTE — Assessment & Plan Note (Signed)
Chronic Continue vitamin d daily

## 2020-06-05 NOTE — Patient Instructions (Addendum)
   Medications reviewed and updated.  Changes include :   none     Please followup in 1 year

## 2020-06-05 NOTE — Assessment & Plan Note (Signed)
Chronic Gets intermittent injections Takes low dose advil 1-2 times daily

## 2020-06-05 NOTE — Assessment & Plan Note (Signed)
Chronic Controlled with lifestyle Regular exercise and healthy diet encouraged

## 2020-06-12 DIAGNOSIS — Z23 Encounter for immunization: Secondary | ICD-10-CM | POA: Diagnosis not present

## 2020-06-27 DIAGNOSIS — M1711 Unilateral primary osteoarthritis, right knee: Secondary | ICD-10-CM | POA: Diagnosis not present

## 2020-07-04 DIAGNOSIS — M1711 Unilateral primary osteoarthritis, right knee: Secondary | ICD-10-CM | POA: Diagnosis not present

## 2020-07-11 DIAGNOSIS — M1711 Unilateral primary osteoarthritis, right knee: Secondary | ICD-10-CM | POA: Diagnosis not present

## 2020-08-29 DIAGNOSIS — Z961 Presence of intraocular lens: Secondary | ICD-10-CM | POA: Diagnosis not present

## 2020-08-29 DIAGNOSIS — H52203 Unspecified astigmatism, bilateral: Secondary | ICD-10-CM | POA: Diagnosis not present

## 2020-11-22 ENCOUNTER — Encounter: Payer: Self-pay | Admitting: Internal Medicine

## 2020-11-22 ENCOUNTER — Other Ambulatory Visit: Payer: Self-pay

## 2020-11-22 DIAGNOSIS — R5383 Other fatigue: Secondary | ICD-10-CM | POA: Insufficient documentation

## 2020-11-22 NOTE — Progress Notes (Signed)
Subjective:    Patient ID: Denise Wagner, female    DOB: 1923-12-28, 87 y.o.   MRN: [id number]  HPI The patient is here for an acute visit.   BP issues: She has been checking her blood pressure at home with her blood pressure cuff and her BP was very elevated-170s and 160s.  Then it would drop down and go very low.  She put new batteries in her cuff and it seems to be working better so she thinks it was just the batteries.  She did that this morning.   She has not felt well.  This started a couple of days ago. She feels tired.  She does not want to get up and go.  Her legs are more swollen - it is worse at night.   She elevates them in a recliner.    She gets knee injections for OA.  She is concerned about falling.  She feels she is less active because of the knee pain and being afraid of falling.  She thinks she is also just gotten lazy she does have some increased fatigue when doing some cleaning around the house and has to stop.  She still goes to the [employer] almost every day to get food.  She does not feel as good walking around the store.  She denies any shortness of breath.  She denies any chest pain.   Medications and allergies reviewed with patient and updated if appropriate.  Patient Active Problem List   Diagnosis Date Noted  . Fatigue 11/22/2020  . Bilateral primary osteoarthritis of knee 06/05/2020  . Senile purpura ([facility]) 06/05/2020  . Nosebleed 02/28/2020  . Allergic rhinitis 12/08/2016  . Vitamin D deficiency 10/25/2007  . Hyperlipidemia 10/25/2007  . Essential hypertension 10/25/2007  . Osteopenia 08/18/2007    Current Outpatient Medications on File Prior to Visit  Medication Sig Dispense Refill  . amLODipine (NORVASC) 2.5 MG tablet TAKE 1 TABLET DAILY 90 tablet 3  . aspirin 81 MG tablet Take 81 mg by mouth daily.    Marland Kitchen CALCIUM-VITAMIN D PO Take 1 tablet by mouth daily.     . Cholecalciferol (VITAMIN D3) 1000 UNITS CAPS Take 1,000 mg by mouth daily.    .  Ibuprofen (ADVIL PO) Take 1 tablet by mouth daily as needed (PAIN).    Marland Kitchen L-Lysine 500 MG TABS Take 500 mg by mouth daily.    Marland Kitchen losartan-hydrochlorothiazide (HYZAAR) 100-25 MG tablet TAKE 1 TABLET DAILY 90 tablet 3  . metoprolol tartrate (LOPRESSOR) 25 MG tablet TAKE 1 TABLET TWICE A DAY 180 tablet 3  . Multiple Vitamins-Calcium (VIACTIV MULTI-VITAMIN PO) Take 2 tablets by mouth daily.    . Omega-3 Fatty Acids (FISH OIL) 1000 MG CAPS Take 1,000 mg by mouth 2 (two) times daily.     . vitamin C (ASCORBIC ACID) 500 MG tablet Take 500 mg by mouth daily.     . vitamin E 180 MG (400 UNITS) capsule Take 400 Units by mouth daily.    . Zinc Acetate, Oral, (ZINC ACETATE PO) Take 1 tablet by mouth daily.     No current facility-administered medications on file prior to visit.    Past Medical History:  Diagnosis Date  . Hyperlipidemia   . Hypertension   . Mild anemia   . Mild aortic stenosis   . Mild tricuspid regurgitation   . Osteopenia   . Vitamin D deficiency     Past Surgical History:  Procedure Laterality Date  .  cataract surgery  2007   bilaterally  . gravida 0,para 0,mis 0, Dr.Gottsegen    . HEMORRHOID SURGERY    . no colonoscopy     "I never had a problem" ([place] reviewed)  . synvisc injections; r knee     Dr Lynann Bologna    Social History   Socioeconomic History  . Marital status: Widowed    Spouse name: Not on file  . Number of children: 1  . Years of education: Not on file  . Highest education level: Not on file  Occupational History  . Occupation: retired    Fish farm manager: RETIRED  Tobacco Use  . Smoking status: Never Smoker  . Smokeless tobacco: Never Used  Vaping Use  . Vaping Use: Never used  Substance and Sexual Activity  . Alcohol use: Yes    Comment: rarely  . Drug use: No  . Sexual activity: Never  Other Topics Concern  . Not on file  Social History Narrative   No diet         Social Determinants of Health   Financial Resource Strain: Low Risk   .  Difficulty of Paying Living Expenses: Not hard at all  Food Insecurity: No Food Insecurity  . Worried About Charity fundraiser in the Last Year: Never true  . Ran Out of Food in the Last Year: Never true  Transportation Needs: No Transportation Needs  . Lack of Transportation (Medical): No  . Lack of Transportation (Non-Medical): No  Physical Activity: Sufficiently Active  . Days of Exercise per Week: 5 days  . Minutes of Exercise per Session: 30 min  Stress: No Stress Concern Present  . Feeling of Stress : Not at all  Social Connections: Moderately Integrated  . Frequency of Communication with Friends and Family: More than three times a week  . Frequency of Social Gatherings with Friends and Family: More than three times a week  . Attends Religious Services: More than 4 times per year  . Active Member of Clubs or Organizations: Yes  . Attends Archivist Meetings: More than 4 times per year  . Marital Status: Widowed    Family History  Problem Relation Age of Onset  . Arthritis Mother   . Heart disease Brother        MI in 72's  . Diabetes Maternal Grandmother   . Heart disease Cousin        MI  . Breast cancer Sister   . Stroke Neg Hx     Review of Systems  Constitutional: Positive for appetite change (decreased) and fatigue.  HENT: Positive for congestion (seasonal allergies).   Eyes: Negative for visual disturbance.  Respiratory: Positive for shortness of breath (? ). Negative for cough and wheezing.   Cardiovascular: Positive for leg swelling. Negative for chest pain and palpitations.  Gastrointestinal: Negative for abdominal pain, constipation, diarrhea and nausea.  Genitourinary: Negative for dysuria and hematuria.  Musculoskeletal: Positive for arthralgias (b/l knees).  Neurological: Negative for weakness, light-headedness, numbness and headaches.       Objective:   Vitals:   11/23/20 1418  BP: 130/62  Pulse: 83  Temp: 98.4 F (36.9 C)  SpO2:  95%   BP Readings from Last 3 Encounters:  11/23/20 130/62  06/05/20 134/68  05/10/20 130/70   Wt Readings from Last 3 Encounters:  11/23/20 150 lb (68 kg)  06/05/20 154 lb (69.9 kg)  05/10/20 155 lb 3.2 oz (70.4 kg)   Body mass index is 24.96  kg/m.   Physical Exam    Constitutional: Appears well-developed and well-nourished. No distress.  Head: Normocephalic and atraumatic.  Neck: Neck supple. No tracheal deviation present. No thyromegaly present.  No cervical lymphadenopathy Cardiovascular: Normal rate, regular rhythm and normal heart sounds.  2/6 sys murmur heard. No carotid bruit .  1+ b/l LE mildly pitting edema Pulmonary/Chest: Effort normal and breath sounds normal. No respiratory distress. No has no wheezes. No rales.  Skin: Skin is warm and dry. Not diaphoretic.  Psychiatric: Normal mood and affect. Behavior is normal.       Assessment & Plan:    See Problem List for Assessment and Plan of chronic medical problems.    This visit occurred during the SARS-CoV-2 public health emergency.  Safety protocols were in place, including screening questions prior to the visit, additional usage of staff PPE, and extensive cleaning of exam room while observing appropriate contact time as indicated for disinfecting solutions.

## 2020-11-23 ENCOUNTER — Ambulatory Visit (INDEPENDENT_AMBULATORY_CARE_PROVIDER_SITE_OTHER): Payer: Medicare Other | Admitting: Internal Medicine

## 2020-11-23 VITALS — BP 130/62 | HR 83 | Temp 98.4°F | Ht 65.0 in | Wt 150.0 lb

## 2020-11-23 DIAGNOSIS — I10 Essential (primary) hypertension: Secondary | ICD-10-CM | POA: Diagnosis not present

## 2020-11-23 DIAGNOSIS — I5189 Other ill-defined heart diseases: Secondary | ICD-10-CM | POA: Insufficient documentation

## 2020-11-23 DIAGNOSIS — R600 Localized edema: Secondary | ICD-10-CM | POA: Diagnosis not present

## 2020-11-23 DIAGNOSIS — I350 Nonrheumatic aortic (valve) stenosis: Secondary | ICD-10-CM | POA: Insufficient documentation

## 2020-11-23 DIAGNOSIS — I351 Nonrheumatic aortic (valve) insufficiency: Secondary | ICD-10-CM | POA: Insufficient documentation

## 2020-11-23 DIAGNOSIS — R5383 Other fatigue: Secondary | ICD-10-CM | POA: Diagnosis not present

## 2020-11-23 LAB — CBC WITH DIFFERENTIAL/PLATELET
Basophils Absolute: 0 10*3/uL (ref 0.0–0.1)
Basophils Relative: 0.5 % (ref 0.0–3.0)
Eosinophils Absolute: 0.1 10*3/uL (ref 0.0–0.7)
Eosinophils Relative: 1.2 % (ref 0.0–5.0)
HCT: 37.3 % (ref 36.0–46.0)
Hemoglobin: 12.9 g/dL (ref 12.0–15.0)
Lymphocytes Relative: 39.1 % (ref 12.0–46.0)
Lymphs Abs: 2.6 10*3/uL (ref 0.7–4.0)
MCHC: 34.6 g/dL (ref 30.0–36.0)
MCV: 93.7 fl (ref 78.0–100.0)
Monocytes Absolute: 0.5 10*3/uL (ref 0.1–1.0)
Monocytes Relative: 8 % (ref 3.0–12.0)
Neutro Abs: 3.4 10*3/uL (ref 1.4–7.7)
Neutrophils Relative %: 51.2 % (ref 43.0–77.0)
Platelets: 178 10*3/uL (ref 150.0–400.0)
RBC: 3.98 Mil/uL (ref 3.87–5.11)
RDW: 13.4 % (ref 11.5–15.5)
WBC: 6.7 10*3/uL (ref 4.0–10.5)

## 2020-11-23 LAB — COMPREHENSIVE METABOLIC PANEL
ALT: 12 U/L (ref 0–35)
AST: 16 U/L (ref 0–37)
Albumin: 4 g/dL (ref 3.5–5.2)
Alkaline Phosphatase: 39 U/L (ref 39–117)
BUN: 18 mg/dL (ref 6–23)
CO2: 29 mEq/L (ref 19–32)
Calcium: 9.9 mg/dL (ref 8.4–10.5)
Chloride: 94 mEq/L — ABNORMAL LOW (ref 96–112)
Creatinine, Ser: 0.87 mg/dL (ref 0.40–1.20)
GFR: 56.99 mL/min — ABNORMAL LOW (ref >60.00)
Glucose, Bld: 135 mg/dL — ABNORMAL HIGH (ref 70–99)
Potassium: 3.6 mEq/L (ref 3.5–5.1)
Sodium: 130 mEq/L — ABNORMAL LOW (ref 135–145)
Total Bilirubin: 0.8 mg/dL (ref 0.2–1.2)
Total Protein: 6.4 g/dL (ref 6.0–8.3)

## 2020-11-23 LAB — TSH: TSH: 1.59 u[IU]/mL (ref 0.35–4.50)

## 2020-11-23 LAB — BRAIN NATRIURETIC PEPTIDE: Pro B Natriuretic peptide (BNP): 55 pg/mL (ref 0.0–100.0)

## 2020-11-23 NOTE — Patient Instructions (Addendum)
   Blood work was ordered.     Medications changes include :  none      Monitor your BP at home and keep a record of your readings.   A referral was ordered for cardiology.       Someone from their office will call you to schedule an appointment.    Please followup in 3-4 weeks

## 2020-11-23 NOTE — Assessment & Plan Note (Signed)
New She is typically very active and denies fatigue ?  Cardiac in nature and ischemia versus decompensated diastolic heart failure ?  Related to doing less because of knee pain Encouraged her to do as much walking as possible Check CBC, CMP, TSH, BNP Refer to cardiology

## 2020-11-23 NOTE — Assessment & Plan Note (Signed)
Chronic Blood pressure appears to be well controlled here today Advised that she bring her cuff with her to her next appointment Advised that she will monitor her BP at home and keep a log Will continue current medications-amlodipine 2.5 mg daily, losartan-hydrochlorothiazide 100-25 mg daily and metoprolol 25 mg daily There has been no change in her medication for a while CMP

## 2020-11-23 NOTE — Assessment & Plan Note (Signed)
Chronic Left leg is always been worse than right Leg swelling is good first thing morning and gets worse as the day goes on-this is typical for her, but worse ?  Related to decompensated diastolic function ?  Related to walking around less because knee pain is worse Check CBC, CMP, BNP Last blood work showed slight decrease in GFR and hyponatremia-?  Diastolic heart failure

## 2020-11-26 ENCOUNTER — Telehealth: Payer: Self-pay | Admitting: Internal Medicine

## 2020-11-26 NOTE — Telephone Encounter (Signed)
Spoke with daughter today and results given.

## 2020-11-26 NOTE — Telephone Encounter (Signed)
   Please call with lab results Patient is currently staying with her daughter  Call [phone]

## 2020-11-27 ENCOUNTER — Telehealth: Payer: Self-pay | Admitting: Internal Medicine

## 2020-11-27 NOTE — Telephone Encounter (Signed)
That BP number is not concerning unless she is having symptoms.  Is she lightheaded or dizzy?   Have her stop the amlodipine.     Continue other medications. Monitor BP

## 2020-11-27 NOTE — Telephone Encounter (Signed)
Called and spoke with patient's daughter today and info given.

## 2020-11-27 NOTE — Telephone Encounter (Signed)
Patients daughter calling, states the patients BP is 129/57 and continuing to drop. Also states it is the lowest it has ever been. Transferred to team health for further evaluation.

## 2020-11-29 ENCOUNTER — Encounter: Payer: Self-pay | Admitting: Cardiology

## 2020-11-29 ENCOUNTER — Other Ambulatory Visit: Payer: Self-pay

## 2020-11-29 ENCOUNTER — Ambulatory Visit (INDEPENDENT_AMBULATORY_CARE_PROVIDER_SITE_OTHER): Payer: Medicare Other | Admitting: Cardiology

## 2020-11-29 VITALS — BP 140/58 | HR 62 | Ht 64.0 in | Wt 154.6 lb

## 2020-11-29 DIAGNOSIS — R600 Localized edema: Secondary | ICD-10-CM | POA: Diagnosis not present

## 2020-11-29 DIAGNOSIS — I350 Nonrheumatic aortic (valve) stenosis: Secondary | ICD-10-CM

## 2020-11-29 DIAGNOSIS — R0609 Other forms of dyspnea: Secondary | ICD-10-CM

## 2020-11-29 DIAGNOSIS — I5189 Other ill-defined heart diseases: Secondary | ICD-10-CM

## 2020-11-29 DIAGNOSIS — I10 Essential (primary) hypertension: Secondary | ICD-10-CM | POA: Diagnosis not present

## 2020-11-29 DIAGNOSIS — R0600 Dyspnea, unspecified: Secondary | ICD-10-CM

## 2020-11-29 NOTE — Progress Notes (Signed)
Primary Care Provider: Binnie Rail, MD Cardiologist: Glenetta Hew, MD Electrophysiologist: None  Clinic Note: Chief Complaint  Patient presents with  . Edema    L ankle > R  . Hypertension   ===================================  ASSESSMENT/PLAN   Problem List Items Addressed This Visit    Bilateral leg edema - Primary    Chronic condition - also associated with mild spider veins.  Recommend: foot elevation & support hose (can use zipper hose).      Relevant Orders   ECHOCARDIOGRAM COMPLETE   Dyspnea   Relevant Orders   EKG 12-Lead   ECHOCARDIOGRAM COMPLETE   Essential hypertension (Chronic)    BP actually seems to be stable according to her BP log -  She is on a total of 4 meds.  Would not aggressively titrate based upon BP log with SBP range 120-150 mmHg in a 87 y/o --> would be leery of the potential of orthostatic hypotension  Plan:  Continue Losartan-HCTZ & Metoprolol (25 mg BID - AM doses)  Change amlodipine 2.5 mg to PM dose of Metoprolol       Mild aortic stenosis (Chronic)    Mild AS (with AI) --   With her HTN & swelling, need to excluded worsening valvular disease.  -- Check 2 D Echo      Relevant Orders   EKG 12-Lead   ECHOCARDIOGRAM COMPLETE   Grade I diastolic dysfunction (Chronic)    Unlikely that her edema is related to HFpEF in the absence of Sx of PND or orthopnea. Perhaps some mild exertional dyspnea could be related to mild HFpEF.  Need to reassess EF & aortic valvular disease  - Check 2 D Echo      Relevant Orders   EKG 12-Lead   ECHOCARDIOGRAM COMPLETE     ===================================  HPI:    Denise Wagner is a 87 y.o. female with a PMH noted for hypertension, hyperlipidemia, mild anemia who is being seen today today for worsening fatigue and swelling at the request of Binnie Rail, MD  Denise Wagner was last seen on July 18, 2016 by Melina Copa, PA-C in hospital follow-up after being seen by Dr.  Meda Coffee consultation during hospitalization for chest pain.  2DEchocardiogram showing hyperdynamic LV function with EF 65 to 70%.  Mild aortic stenosis, mild AI.  She was very active, attending gym classes.  No ischemic evaluation recommended.  -> She was seen twice, blood pressure seem to be controlled.  She was seen by Dr. Quay Burow on April 29, indicating that her blood pressures were ranging from the 170s and 160s to much lower pressures.  She thought that maybe the batteries were not working well.  He was feeling more tired, lack of get up and go with some leg swelling.  Does not feel as strong walking around the store.  Notes decreased appetite shortness of breath and swelling.Marland Kitchen  Referred to cardiology because of potential decompensated diastolic heart failure.  Recent Hospitalizations: None  Reviewed  CV studies:    The following studies were reviewed today: (if available, images/films reviewed: From Epic Chart or Care Everywhere) . 2D Echo November 2017: Normal EF 65 to 70%.  Mild LVH.  GR 1 DD which for a 87 year old is normal for age.  Borderline Mild AS.  Mild AI.  Mildly elevated PAP.  Interval History:   Denise Wagner is here today saying that she is actually feeling much better.  She says that she still has Impella  but it is better intensity on line.  Her blood pressures have been higher, but there is also been some blood pressures.  She brought a blood pressure log from April 30 until now: Low BP was 120/60 6 mmHg-HR 69 bpm, high was 150/70 3 mmHg with HR of 79 bpm.  She really has not had any headache symptoms or blurred vision.  She does get short of breath on occasion, not all the time.  Her routine exercise is going to the store and walking up and down the grocery store aisles with the cart.  She has been a little more short of breath than usual during this, but is getting back to her baseline.  Has right knee pain that limits her activity at this point.  She has chronic left greater  than right leg ankle swelling that seems to be stable now.  No associated  PND orthopnea, no chest pain or pressure.  No sense of irregular heartbeats palpitations.  No lightheadedness or dizziness or wooziness.  CV Review of Symptoms (Summary): positive for - dyspnea on exertion and edema negative for - chest pain, irregular heartbeat, orthopnea, palpitations, paroxysmal nocturnal dyspnea, rapid heart rate, shortness of breath or Lightheadedness or dizziness, syncope/near syncope or TIA/amaurosis fugax, claudication  The patient does not have symptoms concerning for COVID-19 infection (fever, chills, cough, or new shortness of breath).   REVIEWED OF SYSTEMS   Review of Systems  Constitutional: Positive for malaise/fatigue (doesn't really have lots of energy - not really fatigued).  HENT: Negative for congestion and nosebleeds.   Respiratory: Positive for shortness of breath (a little). Negative for cough and wheezing.   Cardiovascular: Positive for leg swelling.  Gastrointestinal: Negative for abdominal pain, blood in stool and melena.  Genitourinary: Negative for hematuria.  Musculoskeletal: Positive for joint pain (R> L Knee pain). Negative for falls and myalgias.  Neurological: Negative for dizziness, focal weakness and weakness.  Psychiatric/Behavioral: Positive for memory loss. Negative for depression. The patient is not nervous/anxious and does not have insomnia.    I have reviewed and (if needed) personally updated the patient's problem list, medications, allergies, past medical and surgical history, social and family history.   PAST MEDICAL HISTORY   Past Medical History:  Diagnosis Date  . Hyperlipidemia   . Hypertension   . Mild anemia   . Mild aortic stenosis   . Mild tricuspid regurgitation   . Osteopenia   . Vitamin D deficiency     PAST SURGICAL HISTORY   Past Surgical History:  Procedure Laterality Date  . cataract surgery  2007   bilaterally  . gravida  0,para 0,mis 0, Dr.Gottsegen    . HEMORRHOID SURGERY    . no colonoscopy     "I never had a problem" ([facility] reviewed)  . synvisc injections; r knee     Dr Lynann Bologna  . TRANSTHORACIC ECHOCARDIOGRAM  05/2016   Normal EF 65 to 70%.  Mild LVH.  GR 1 DD which for a 87 year old is normal for age.  Borderline Mild AS.  Mild AI.  Mildly elevated PAP.    Immunization History  Administered Date(s) Administered  . Fluad Quad(high Dose 65+) 05/05/2019, 05/10/2020  . Influenza Split 05/22/2011, 04/28/2012  . Influenza Whole 05/11/2007, 04/19/2008, 07/05/2008, 05/15/2009, 04/29/2010  . Influenza, High Dose Seasonal PF 05/25/2013, 04/30/2016, 05/07/2017, 04/29/2018  . Influenza,inj,Quad PF,6+ Mos 05/05/2014, 05/10/2015  . PFIZER(Purple Top)SARS-COV-2 Vaccination 08/12/2019, 09/02/2019  . Pneumococcal Conjugate-13 05/05/2014  . Pneumococcal Polysaccharide-23 07/29/1999  . Td 01/04/1997  .  Tetanus 07/14/2012  . Zoster 06/08/2014    MEDICATIONS/ALLERGIES   Current Meds  Medication Sig  . amLODipine (NORVASC) 2.5 MG tablet TAKE 1 TABLET DAILY  . aspirin 81 MG tablet Take 81 mg by mouth daily.  Marland Kitchen CALCIUM-VITAMIN D PO Take 1 tablet by mouth daily.   . Cholecalciferol (VITAMIN D3) 1000 UNITS CAPS Take 1,000 mg by mouth daily.  . Ibuprofen (ADVIL PO) Take 1 tablet by mouth daily as needed (PAIN).  Marland Kitchen L-Lysine 500 MG TABS Take 500 mg by mouth daily.  Marland Kitchen loratadine (CLARITIN) 10 MG tablet Take 10 mg by mouth every evening.  Marland Kitchen losartan-hydrochlorothiazide (HYZAAR) 100-25 MG tablet TAKE 1 TABLET DAILY  . metoprolol tartrate (LOPRESSOR) 25 MG tablet TAKE 1 TABLET TWICE A DAY  . Multiple Vitamins-Calcium (VIACTIV MULTI-VITAMIN PO) Take 2 tablets by mouth daily.  . Omega-3 Fatty Acids (FISH OIL) 1000 MG CAPS Take 1,000 mg by mouth 2 (two) times daily.   . vitamin C (ASCORBIC ACID) 500 MG tablet Take 500 mg by mouth daily.   . vitamin E 180 MG (400 UNITS) capsule Take 400 Units by mouth daily.  . Zinc Acetate,  Oral, (ZINC ACETATE PO) Take 1 tablet by mouth daily.    Allergies  Allergen Reactions  . Doxycycline     REACTION: hives  . Metronidazole   . Penicillins   . Sulfonamide Derivatives   . Pravastatin     11/2010 Leg weakness     SOCIAL HISTORY/FAMILY HISTORY   Reviewed in Epic:  Pertinent findings:  Social History   Tobacco Use  . Smoking status: Never Smoker  . Smokeless tobacco: Never Used  Vaping Use  . Vaping Use: Never used  Substance Use Topics  . Alcohol use: Yes    Comment: rarely  . Drug use: No   Social History   Social History Narrative   No diet         Family History: Arthritis in her mother; Breast cancer in her sister; Diabetes in her maternal grandmother; Heart disease in her brother and cousin.  OBJCTIVE -PE, EKG, labs   Wt Readings from Last 3 Encounters:  11/29/20 154 lb 9.6 oz (70.1 kg)  11/23/20 150 lb (68 kg)  06/05/20 154 lb (69.9 kg)    Physical Exam: BP (!) 140/58   Pulse 62   Ht 5\' 4"  (1.626 m)   Wt 154 lb 9.6 oz (70.1 kg)   SpO2 98%   BMI 26.54 kg/m  Physical Exam Vitals reviewed.  Constitutional:      General: She is not in acute distress.    Appearance: Normal appearance. She is normal weight. She is not ill-appearing or toxic-appearing.  HENT:     Head: Normocephalic and atraumatic.  Neck:     Vascular: No carotid bruit, hepatojugular reflux or JVD.  Cardiovascular:     Rate and Rhythm: Normal rate and regular rhythm.  No extrasystoles are present.    Chest Wall: PMI is not displaced.     Pulses: Normal pulses.     Heart sounds: Murmur heard.   Harsh crescendo-decrescendo midsystolic murmur is present with a grade of 1/6 at the upper right sternal border radiating to the neck. No friction rub. No gallop.   Pulmonary:     Effort: Pulmonary effort is normal. No respiratory distress.     Breath sounds: Normal breath sounds.  Chest:     Chest wall: No tenderness.  Musculoskeletal:        General: Swelling (  L > R  ankle edema) present. Normal range of motion.     Cervical back: Normal range of motion and neck supple.  Skin:    General: Skin is warm and dry.  Neurological:     General: No focal deficit present.     Mental Status: She is alert and oriented to person, place, and time. Mental status is at baseline.     Motor: No weakness.     Gait: Gait abnormal (antalgic).  Psychiatric:        Mood and Affect: Mood normal.        Behavior: Behavior normal.        Thought Content: Thought content normal.        Judgment: Judgment normal.     Adult ECG Report  Rate: 62 ;  Rhythm: normal sinus rhythm; left axis, LVH.  Otherwise normal, intervals and durations.  Narrative Interpretation: Standard  Recent Labs:  BNP 55  Lab Results  Component Value Date   CHOL 206 (H) 12/28/2018   HDL 52.30 12/28/2018   LDLCALC 135 (H) 12/28/2018   LDLDIRECT 152.8 08/26/2013   TRIG 91.0 12/28/2018   CHOLHDL 4 12/28/2018   Lab Results  Component Value Date   CREATININE 0.87 11/23/2020   BUN 18 11/23/2020   NA 130 (L) 11/23/2020   K 3.6 11/23/2020   CL 94 (L) 11/23/2020   CO2 29 11/23/2020   CBC Latest Ref Rng & Units 11/23/2020 02/28/2020 12/28/2018  WBC 4.0 - 10.5 K/uL 6.7 9.7 8.0  Hemoglobin 12.0 - 15.0 g/dL 12.9 13.2 12.9  Hematocrit 36.0 - 46.0 % 37.3 38.5 36.5  Platelets 150.0 - 400.0 K/uL 178.0 198 201.0    Lab Results  Component Value Date   TSH 1.59 11/23/2020    ==================================================  COVID-19 Education: The signs and symptoms of COVID-19 were discussed with the patient and how to seek care for testing (follow up with PCP or arrange E-visit).   The importance of social distancing and COVID-19 vaccination was discussed today. The patient is practicing social distancing & Masking.   I spent a total of 41 minutes with the patient spent in direct patient consultation.  Additional time spent with chart review  / charting (studies, outside notes, etc): 10 min Total  Time: 51 min   Current medicines are reviewed at length with the patient today.  (+/- concerns) n/a  This visit occurred during the SARS-CoV-2 public health emergency.  Safety protocols were in place, including screening questions prior to the visit, additional usage of staff PPE, and extensive cleaning of exam room while observing appropriate contact time as indicated for disinfecting solutions.  Notice: This dictation was prepared with Dragon dictation along with smaller phrase technology. Any transcriptional errors that result from this process are unintentional and may not be corrected upon review.  Patient Instructions / Medication Changes & Studies & Tests Ordered   Patient Instructions  Medication Instructions:   No changes  *If you need a refill on your cardiac medications before your next appointment, please call your pharmacy*   Lab Work:  Not needed   Testing/Procedures: Will be schedule at [facility] has requested that you have an echocardiogram. Echocardiography is a painless test that uses sound waves to create images of your heart. It provides your doctor with information about the size and shape of your heart and how well your heart's chambers and valves are working. This procedure takes approximately one hour. There  are no restrictions for this procedure.     Follow-Up: At [facility], you and your health needs are our priority.  As part of our continuing mission to provide you with exceptional heart care, we have created designated Provider Care Teams.  These Care Teams include your primary Cardiologist (physician) and Advanced Practice Providers (APPs -  Physician Assistants and Nurse Practitioners) who all work together to provide you with the care you need, when you need it.     Your next appointment:   1 to 2 month(s)  The format for your next appointment:   in person or virtual   Provider:   Glenetta Hew,  MD      Studies Ordered:   Orders Placed This Encounter  Procedures  . EKG 12-Lead  . ECHOCARDIOGRAM COMPLETE     Glenetta Hew, M.D., M.S. Interventional Cardiologist   Pager # [phone] Phone # [phone] [address] [place], [place] [id number]   Thank you for choosing Heartcare at [facility]!!

## 2020-11-29 NOTE — Patient Instructions (Addendum)
Medication Instructions:   No changes  *If you need a refill on your cardiac medications before your next appointment, please call your pharmacy*   Lab Work:  Not needed   Testing/Procedures: Will be schedule at [facility] has requested that you have an echocardiogram. Echocardiography is a painless test that uses sound waves to create images of your heart. It provides your doctor with information about the size and shape of your heart and how well your heart's chambers and valves are working. This procedure takes approximately one hour. There are no restrictions for this procedure.     Follow-Up: At [facility], you and your health needs are our priority.  As part of our continuing mission to provide you with exceptional heart care, we have created designated Provider Care Teams.  These Care Teams include your primary Cardiologist (physician) and Advanced Practice Providers (APPs -  Physician Assistants and Nurse Practitioners) who all work together to provide you with the care you need, when you need it.     Your next appointment:   1 to 2 month(s)  The format for your next appointment:   in person or virtual   Provider:   Glenetta Hew, MD

## 2020-12-02 ENCOUNTER — Encounter: Payer: Self-pay | Admitting: Cardiology

## 2020-12-02 NOTE — Assessment & Plan Note (Signed)
BP actually seems to be stable according to her BP log -  She is on a total of 4 meds.  Would not aggressively titrate based upon BP log with SBP range 120-150 mmHg in a 87 y/o --> would be leery of the potential of orthostatic hypotension  Plan:  Continue Losartan-HCTZ & Metoprolol (25 mg BID - AM doses)  Change amlodipine 2.5 mg to PM dose of Metoprolol

## 2020-12-02 NOTE — Assessment & Plan Note (Addendum)
Unlikely that her edema is related to HFpEF in the absence of Sx of PND or orthopnea. Perhaps some mild exertional dyspnea could be related to mild HFpEF.  Need to reassess EF & aortic valvular disease  - Check 2 D Echo

## 2020-12-02 NOTE — Assessment & Plan Note (Signed)
Mild AS (with AI) --   With her HTN & swelling, need to excluded worsening valvular disease.  -- Check 2 D Echo

## 2020-12-02 NOTE — Assessment & Plan Note (Signed)
Chronic condition - also associated with mild spider veins.  Recommend: foot elevation & support hose (can use zipper hose).

## 2020-12-13 ENCOUNTER — Encounter: Payer: Self-pay | Admitting: Internal Medicine

## 2020-12-13 NOTE — Progress Notes (Signed)
Subjective:    Patient ID: Denise Wagner, female    DOB: January 16, 1924, 87 y.o.   MRN: [id number]  HPI The patient is here for follow up.   She is here today with her niece.  Blood pressure - we stopped her amlodipine 5/3.  Blood pressure has been elevated at times.  She is asymptomatic depending on what her numbers are.  Blood pressure 116/60-168/69.    Concern of falling, less active due to knee pain (OA).   Increased fatigue with activity. Referred to cardio = echo ordered.    Still no energy   Cough - allergies in evening.  When outside. Allergies.  She is taking her Claritin daily.  Her family is concerned about her cough and her drainage.   Shakes - feels shaking at times.  ? From blood sugar.  Her daughter wonders if she is anxious.  She denies any anxiety.  Overall she is still remaining less active-it sounds like most of that is related to her knees-they do hurt when she walks.  She still goes to the grocery store.  She is having increased leg swelling, but feels that is related to the warmer weather and being less active.  Medications and allergies reviewed with patient and updated if appropriate.  Patient Active Problem List   Diagnosis Date Noted  . Dyspnea 11/29/2020  . Mild aortic regurgitation 11/23/2020  . Mild aortic stenosis 11/23/2020  . Grade I diastolic dysfunction 27/25/3664  . Bilateral leg edema 11/23/2020  . Fatigue 11/22/2020  . Bilateral primary osteoarthritis of knee 06/05/2020  . Senile purpura ([place]) 06/05/2020  . Nosebleed 02/28/2020  . Allergic rhinitis 12/08/2016  . Vitamin D deficiency 10/25/2007  . Hyperlipidemia 10/25/2007  . Essential hypertension 10/25/2007  . Osteopenia 08/18/2007    Current Outpatient Medications on File Prior to Visit  Medication Sig Dispense Refill  . amLODipine (NORVASC) 2.5 MG tablet TAKE 1 TABLET DAILY 90 tablet 3  . aspirin 81 MG tablet Take 81 mg by mouth daily.    Marland Kitchen CALCIUM-VITAMIN D PO Take 1  tablet by mouth daily.     . Cholecalciferol (VITAMIN D3) 1000 UNITS CAPS Take 1,000 mg by mouth daily.    . Ibuprofen (ADVIL PO) Take 1 tablet by mouth daily as needed (PAIN).    Marland Kitchen L-Lysine 500 MG TABS Take 500 mg by mouth daily.    Marland Kitchen loratadine (CLARITIN) 10 MG tablet Take 10 mg by mouth every evening.    Marland Kitchen losartan-hydrochlorothiazide (HYZAAR) 100-25 MG tablet TAKE 1 TABLET DAILY 90 tablet 3  . metoprolol tartrate (LOPRESSOR) 25 MG tablet TAKE 1 TABLET TWICE A DAY 180 tablet 3  . Multiple Vitamins-Calcium (VIACTIV MULTI-VITAMIN PO) Take 2 tablets by mouth daily.    . Omega-3 Fatty Acids (FISH OIL) 1000 MG CAPS Take 1,000 mg by mouth 2 (two) times daily.     . vitamin C (ASCORBIC ACID) 500 MG tablet Take 500 mg by mouth daily.     . vitamin E 180 MG (400 UNITS) capsule Take 400 Units by mouth daily.    . Zinc Acetate, Oral, (ZINC ACETATE PO) Take 1 tablet by mouth daily.     No current facility-administered medications on file prior to visit.    Past Medical History:  Diagnosis Date  . Hyperlipidemia   . Hypertension   . Mild anemia   . Mild aortic stenosis   . Mild tricuspid regurgitation   . Osteopenia   . Vitamin D deficiency  Past Surgical History:  Procedure Laterality Date  . cataract surgery  2007   bilaterally  . gravida 0,para 0,mis 0, Dr.Gottsegen    . HEMORRHOID SURGERY    . no colonoscopy     "I never had a problem" ([place] reviewed)  . synvisc injections; r knee     Dr Lynann Bologna  . TRANSTHORACIC ECHOCARDIOGRAM  05/2016   Normal EF 65 to 70%.  Mild LVH.  GR 1 DD which for a 87 year old is normal for age.  Borderline Mild AS.  Mild AI.  Mildly elevated PAP.    Social History   Socioeconomic History  . Marital status: Widowed    Spouse name: Not on file  . Number of children: 1  . Years of education: Not on file  . Highest education level: Not on file  Occupational History  . Occupation: retired    Fish farm manager: RETIRED  Tobacco Use  . Smoking status: Never  Smoker  . Smokeless tobacco: Never Used  Vaping Use  . Vaping Use: Never used  Substance and Sexual Activity  . Alcohol use: Yes    Comment: rarely  . Drug use: No  . Sexual activity: Never  Other Topics Concern  . Not on file  Social History Narrative   No diet         Social Determinants of Health   Financial Resource Strain: Low Risk   . Difficulty of Paying Living Expenses: Not hard at all  Food Insecurity: No Food Insecurity  . Worried About Charity fundraiser in the Last Year: Never true  . Ran Out of Food in the Last Year: Never true  Transportation Needs: No Transportation Needs  . Lack of Transportation (Medical): No  . Lack of Transportation (Non-Medical): No  Physical Activity: Sufficiently Active  . Days of Exercise per Week: 5 days  . Minutes of Exercise per Session: 30 min  Stress: No Stress Concern Present  . Feeling of Stress : Not at all  Social Connections: Moderately Integrated  . Frequency of Communication with Friends and Family: More than three times a week  . Frequency of Social Gatherings with Friends and Family: More than three times a week  . Attends Religious Services: More than 4 times per year  . Active Member of Clubs or Organizations: Yes  . Attends Archivist Meetings: More than 4 times per year  . Marital Status: Widowed    Family History  Problem Relation Age of Onset  . Arthritis Mother   . Heart disease Brother        MI in 32's  . Diabetes Maternal Grandmother   . Heart disease Cousin        MI  . Breast cancer Sister   . Stroke Neg Hx     Review of Systems  Constitutional: Positive for appetite change (decreased). Negative for fever.  Respiratory: Positive for cough. Negative for chest tightness, shortness of breath and wheezing.   Cardiovascular: Positive for palpitations (occ) and leg swelling. Negative for chest pain.  Neurological: Negative for light-headedness and headaches.       Objective:   Vitals:    12/14/20 1406  BP: (!) 130/56  Pulse: 75  Temp: 98.3 F (36.8 C)  SpO2: 97%   BP Readings from Last 3 Encounters:  12/14/20 (!) 130/56  11/29/20 (!) 140/58  11/23/20 130/62   Wt Readings from Last 3 Encounters:  12/14/20 152 lb (68.9 kg)  11/29/20 154 lb 9.6 oz (70.1 kg)  11/23/20 150 lb (68 kg)   Body mass index is 26.09 kg/m.   Physical Exam    Constitutional: Appears well-developed and well-nourished. No distress.  HENT:  Head: Normocephalic and atraumatic.  Neck: Neck supple. No tracheal deviation present. No thyromegaly present.  No cervical lymphadenopathy Cardiovascular: Normal rate, regular rhythm and normal heart sounds.   No murmur heard. No carotid bruit .  No edema Pulmonary/Chest: Effort normal and breath sounds normal. No respiratory distress. No has no wheezes. No rales.  Skin: Skin is warm and dry. Not diaphoretic.  Psychiatric: Normal mood and affect. Behavior is normal.      Assessment & Plan:    See Problem List for Assessment and Plan of chronic medical problems.    This visit occurred during the SARS-CoV-2 public health emergency.  Safety protocols were in place, including screening questions prior to the visit, additional usage of staff PPE, and extensive cleaning of exam room while observing appropriate contact time as indicated for disinfecting solutions.

## 2020-12-13 NOTE — Patient Instructions (Addendum)
  A chest xray was ordered.    Medications changes include :   Start singulair nightly.   Your prescription(s) have been submitted to your pharmacy. Please take as directed and contact our office if you believe you are having problem(s) with the medication(s).     Please followup in 6 months, sooner if needed

## 2020-12-14 ENCOUNTER — Ambulatory Visit (INDEPENDENT_AMBULATORY_CARE_PROVIDER_SITE_OTHER): Payer: Medicare Other

## 2020-12-14 ENCOUNTER — Other Ambulatory Visit: Payer: Self-pay

## 2020-12-14 ENCOUNTER — Ambulatory Visit (INDEPENDENT_AMBULATORY_CARE_PROVIDER_SITE_OTHER): Payer: Medicare Other | Admitting: Internal Medicine

## 2020-12-14 ENCOUNTER — Telehealth: Payer: Self-pay | Admitting: Internal Medicine

## 2020-12-14 VITALS — BP 130/56 | HR 75 | Temp 98.3°F | Ht 64.0 in | Wt 152.0 lb

## 2020-12-14 DIAGNOSIS — I10 Essential (primary) hypertension: Secondary | ICD-10-CM | POA: Diagnosis not present

## 2020-12-14 DIAGNOSIS — R5383 Other fatigue: Secondary | ICD-10-CM

## 2020-12-14 DIAGNOSIS — R059 Cough, unspecified: Secondary | ICD-10-CM

## 2020-12-14 DIAGNOSIS — I700 Atherosclerosis of aorta: Secondary | ICD-10-CM | POA: Diagnosis not present

## 2020-12-14 DIAGNOSIS — J301 Allergic rhinitis due to pollen: Secondary | ICD-10-CM | POA: Diagnosis not present

## 2020-12-14 DIAGNOSIS — J302 Other seasonal allergic rhinitis: Secondary | ICD-10-CM

## 2020-12-14 MED ORDER — MONTELUKAST SODIUM 10 MG PO TABS: 10.0000 mg | ORAL_TABLET | Freq: Every day | ORAL | 5 refills | Status: DC

## 2020-12-14 NOTE — Assessment & Plan Note (Signed)
Acute She states she is having a cough related to mucus in her throat She thinks is related to allergies and we will be starting the Singulair 10 mg at bedtime Family wonders about a chest x-ray-we will go ahead and get a chest x-ray given her fatigue to rule out infection

## 2020-12-14 NOTE — Telephone Encounter (Signed)
   Family requesting referral to allergy specialist No preference

## 2020-12-14 NOTE — Assessment & Plan Note (Signed)
Chronic Not ideally controlled Taking Claritin daily-has been on Zyrtec in the past Family member told her about Singulair and she would like to try that, which I think is okay Start Singulair 10 mg at bedtime Family called after the visit and would like her to see an allergist-referred

## 2020-12-14 NOTE — Assessment & Plan Note (Signed)
Subacute Her blood work at her last visit was reassuring I think some of her fatigue is related to her being less active because of her knee arthritis Has seen cardiology and they will rule out valvular issues-Dr. Ellyn Hack did not think it was decompensated diastolic heart failure Encouraged her to try to be more active inside her condo.  She plans on getting back into the water, which she feels helps her energy and her knees Monitor

## 2020-12-14 NOTE — Telephone Encounter (Signed)
Referral ordered

## 2020-12-14 NOTE — Assessment & Plan Note (Signed)
Chronic Blood pressure elevated at times-we will continue to monitor Goal systolic 366-440 Continue current medications-losartan-hydrochlorothiazide 100-25 mg daily, amlodipine 2.5 mg daily, metoprolol 25 mg twice daily

## 2020-12-25 ENCOUNTER — Telehealth (HOSPITAL_COMMUNITY): Payer: Self-pay | Admitting: Cardiology

## 2020-12-25 NOTE — Telephone Encounter (Signed)
Will route to MD and nurse as Juluis Rainier  Thanks!

## 2020-12-25 NOTE — Telephone Encounter (Signed)
Patients daughter called and LVM on 12/24/20 @ 2:50pm ( office closed in observance to [facility] Day) and cancelled Echocardiogram appt for patient. She states that patient is feeling better and does not need anymore. Order will be removed from the ECHO WQ and if patient calls back to reschedule we will reinstate the order. Thank you.

## 2020-12-26 ENCOUNTER — Other Ambulatory Visit (HOSPITAL_COMMUNITY): Payer: Medicare Other

## 2021-01-02 ENCOUNTER — Other Ambulatory Visit: Payer: Self-pay

## 2021-01-02 ENCOUNTER — Telehealth: Payer: Self-pay | Admitting: Internal Medicine

## 2021-01-02 MED ORDER — MONTELUKAST SODIUM 10 MG PO TABS: 10.0000 mg | ORAL_TABLET | Freq: Every day | ORAL | 2 refills | Status: AC

## 2021-01-02 NOTE — Telephone Encounter (Signed)
   Patient requesting order for montelukast (SINGULAIR) 10 MG tablet  Pharmacy [facility], [place]

## 2021-01-02 NOTE — Telephone Encounter (Signed)
Sent today

## 2021-01-17 ENCOUNTER — Telehealth: Payer: Medicare Other | Admitting: Cardiology

## 2021-01-30 ENCOUNTER — Other Ambulatory Visit: Payer: Self-pay | Admitting: Internal Medicine

## 2021-01-30 DIAGNOSIS — I10 Essential (primary) hypertension: Secondary | ICD-10-CM

## 2021-02-22 ENCOUNTER — Telehealth: Payer: Self-pay

## 2021-02-22 NOTE — Telephone Encounter (Signed)
Pts daughter called due to pt calling her stating she is feeling very fatigue to points of inability to get up or walk. Pt is denying any other sxs. Pts daughter gave pt a COVID test and it was NEG.  **I further advise pts daughter to check pts BP and she stated it was 127/64, Unknown O2 and HR. Pts daughter is going to get a Pulse OX from the drug store to check pts O2 and HR when pt is resting and also when pt is moving around for x1 minute with pulse OX remaining on pt finger.  **Pts daughter will then call the office back to inform us of those numbers as that will help provider determine if pt should go to the ED or can wait to be seen for the fatigue.   **MA has been informed of this info.

## 2021-02-22 NOTE — Telephone Encounter (Signed)
Spoke with pts daughter and she has stated Pts O2 sitting was 96 and Hr was 58-62, then she had pt walk around for 1 minute with pulse ox on pts finger and pt O2 was 95 HR was 62.  *Pt is urinating w/o any sxs of UTI, ot is also having normal BM's normally. Pt is also hydrating well.  **I was able to schedule the pt an appointment for 03/04/2021 at 10:40 with Dr, Quay Burow to be further evaluated. Pt will be staying at her daughters until her apptmnt date.

## 2021-02-22 NOTE — Telephone Encounter (Signed)
noted 

## 2021-03-04 ENCOUNTER — Ambulatory Visit: Payer: Medicare Other | Admitting: Internal Medicine

## 2021-03-08 ENCOUNTER — Ambulatory Visit: Payer: Self-pay | Admitting: Allergy

## 2021-03-11 ENCOUNTER — Other Ambulatory Visit: Payer: Self-pay | Admitting: Internal Medicine

## 2021-03-20 ENCOUNTER — Other Ambulatory Visit: Payer: Self-pay | Admitting: Internal Medicine

## 2021-03-20 ENCOUNTER — Ambulatory Visit: Payer: Self-pay | Admitting: Internal Medicine

## 2021-05-03 ENCOUNTER — Other Ambulatory Visit: Payer: Self-pay

## 2021-05-03 ENCOUNTER — Encounter: Payer: Self-pay | Admitting: Internal Medicine

## 2021-05-03 ENCOUNTER — Telehealth (INDEPENDENT_AMBULATORY_CARE_PROVIDER_SITE_OTHER): Payer: Medicare Other | Admitting: Internal Medicine

## 2021-05-03 DIAGNOSIS — J0100 Acute maxillary sinusitis, unspecified: Secondary | ICD-10-CM

## 2021-05-03 MED ORDER — AZITHROMYCIN 250 MG PO TABS: ORAL_TABLET | ORAL | 0 refills | Status: DC

## 2021-05-03 MED ORDER — BENZONATATE 100 MG PO CAPS: 100.0000 mg | ORAL_CAPSULE | Freq: Two times a day (BID) | ORAL | 0 refills | Status: DC | PRN

## 2021-05-03 NOTE — Progress Notes (Signed)
Virtual Visit via telephone Note  I connected with Denise Wagner on 05/03/21 at  3:40 PM EDT by telephone and verified that I am speaking with the correct person using two identifiers.   I discussed the limitations of evaluation and management by telemedicine and the availability of in person appointments. The patient expressed understanding and agreed to proceed.  Present for the visit:  Myself, Dr Billey Gosling, Hosie Spangle.  The patient is currently at home and I am in the office.    No referring provider.    History of Present Illness: She is here for an acute visit for cold symptoms.   Her symptoms started around 9/30  She is experiencing runny nose, nasal congestion with yellow-green nasal discharge, cough, some sinus pressure and some dizziness on occasion.  She denies any fevers, shortness of breath.  She was unsure if this was all allergies.  She has tried different allergy medication and they are not working.  She has tried taking Singulair, Claritin or Zyrtec, saline nasal spray and [place] cough syrup.   Covid test neg.    Review of Systems  Constitutional:  Negative for fever.  HENT:  Positive for congestion (green-yellow mucus) and sinus pain (Mild). Negative for ear pain and sore throat.        Runny nose  Respiratory:  Positive for cough. Negative for shortness of breath and wheezing.   Cardiovascular:  Negative for chest pain.  Gastrointestinal:  Negative for diarrhea and nausea.  Musculoskeletal:  Negative for myalgias.  Neurological:  Positive for dizziness. Negative for headaches.     Social History   Socioeconomic History   Marital status: Widowed    Spouse name: Not on file   Number of children: 1   Years of education: Not on file   Highest education level: Not on file  Occupational History   Occupation: retired    Fish farm manager: RETIRED  Tobacco Use   Smoking status: Never   Smokeless tobacco: Never  Vaping Use   Vaping Use: Never used  Substance  and Sexual Activity   Alcohol use: Yes    Comment: rarely   Drug use: No   Sexual activity: Never  Other Topics Concern   Not on file  Social History Narrative   No diet         Social Determinants of Health   Financial Resource Strain: Low Risk    Difficulty of Paying Living Expenses: Not hard at all  Food Insecurity: No Food Insecurity   Worried About Charity fundraiser in the Last Year: Never true   [place] in the Last Year: Never true  Transportation Needs: No Transportation Needs   Lack of Transportation (Medical): No   Lack of Transportation (Non-Medical): No  Physical Activity: Sufficiently Active   Days of Exercise per Week: 5 days   Minutes of Exercise per Session: 30 min  Stress: No Stress Concern Present   Feeling of Stress : Not at all  Social Connections: Moderately Integrated   Frequency of Communication with Friends and Family: More than three times a week   Frequency of Social Gatherings with Friends and Family: More than three times a week   Attends Religious Services: More than 4 times per year   Active Member of [employer] or Organizations: Yes   Attends Archivist Meetings: More than 4 times per year   Marital Status: Widowed     Observations/Objective:    Assessment and Plan:  See Problem List for Assessment and Plan of chronic medical problems.   Follow Up Instructions:    I discussed the assessment and treatment plan with the patient. The patient was provided an opportunity to ask questions and all were answered. The patient agreed with the plan and demonstrated an understanding of the instructions.   The patient was advised to call back or seek an in-person evaluation if the symptoms worsen or if the condition fails to improve as anticipated.  Time spent on telephone call: 14 minutes  Binnie Rail, MD

## 2021-05-03 NOTE — Assessment & Plan Note (Signed)
Acute Likely bacterial  Start Z-Pak Tessalon Perles 100 mg twice daily as needed Continue allergy medications, saline nasal spray otc cold medications Rest, fluid Call if no improvement

## 2021-05-13 NOTE — Progress Notes (Deleted)
Subjective:    Patient ID: Denise Wagner, female    DOB: 16-Dec-1923, 87 y.o.   MRN: [id number]  This visit occurred during the SARS-CoV-2 public health emergency.  Safety protocols were in place, including screening questions prior to the visit, additional usage of staff PPE, and extensive cleaning of exam room while observing appropriate contact time as indicated for disinfecting solutions.    HPI The patient is here for an acute visit.   I had a VV with her 9/30 for cold symptoms - suggestive of a sinus infection.  I prescribed a zpak and tessalon perles, which she took.    Recurrent nausea, no appetite -    Medications and allergies reviewed with patient and updated if appropriate.  Patient Active Problem List   Diagnosis Date Noted   Dyspnea 11/29/2020   Mild aortic regurgitation 11/23/2020   Mild aortic stenosis 11/23/2020   Grade I diastolic dysfunction 33/82/5053   Bilateral leg edema 11/23/2020   Fatigue 11/22/2020   Bilateral primary osteoarthritis of knee 06/05/2020   Senile purpura ([place]) 06/05/2020   Nosebleed 02/28/2020   Cough 02/24/2017   Acute sinus infection 12/08/2016   Allergic rhinitis 12/08/2016   Vitamin D deficiency 10/25/2007   Hyperlipidemia 10/25/2007   Essential hypertension 10/25/2007   Osteopenia 08/18/2007    Current Outpatient Medications on File Prior to Visit  Medication Sig Dispense Refill   amLODipine (NORVASC) 2.5 MG tablet TAKE 1 TABLET DAILY 90 tablet 3   aspirin 81 MG tablet Take 81 mg by mouth daily.     azithromycin (ZITHROMAX) 250 MG tablet Take two tabs the first day and then one tab daily for four days 6 tablet 0   benzonatate (TESSALON) 100 MG capsule Take 1 capsule (100 mg total) by mouth 2 (two) times daily as needed for cough. 20 capsule 0   CALCIUM-VITAMIN D PO Take 1 tablet by mouth daily.      Cholecalciferol (VITAMIN D3) 1000 UNITS CAPS Take 1,000 mg by mouth daily.     Ibuprofen (ADVIL PO) Take 1 tablet by  mouth daily as needed (PAIN).     L-Lysine 500 MG TABS Take 500 mg by mouth daily.     loratadine (CLARITIN) 10 MG tablet Take 10 mg by mouth every evening.     losartan-hydrochlorothiazide (HYZAAR) 100-25 MG tablet TAKE 1 TABLET DAILY 90 tablet 3   metoprolol tartrate (LOPRESSOR) 25 MG tablet TAKE 1 TABLET TWICE A DAY 180 tablet 3   montelukast (SINGULAIR) 10 MG tablet Take 1 tablet (10 mg total) by mouth at bedtime. 90 tablet 2   Multiple Vitamins-Calcium (VIACTIV MULTI-VITAMIN PO) Take 2 tablets by mouth daily.     Omega-3 Fatty Acids (FISH OIL) 1000 MG CAPS Take 1,000 mg by mouth 2 (two) times daily.      vitamin C (ASCORBIC ACID) 500 MG tablet Take 500 mg by mouth daily.      vitamin E 180 MG (400 UNITS) capsule Take 400 Units by mouth daily.     Zinc Acetate, Oral, (ZINC ACETATE PO) Take 1 tablet by mouth daily.     No current facility-administered medications on file prior to visit.    Past Medical History:  Diagnosis Date   Hyperlipidemia    Hypertension    Mild anemia    Mild aortic stenosis    Mild tricuspid regurgitation    Osteopenia    Vitamin D deficiency     Past Surgical History:  Procedure Laterality Date  cataract surgery  2007   bilaterally   gravida 0,para 0,mis 0, Dr.Gottsegen     HEMORRHOID SURGERY     no colonoscopy     "I never had a problem" (SOC reviewed)   synvisc injections; r knee     Dr Lynann Bologna   TRANSTHORACIC ECHOCARDIOGRAM  05/2016   Normal EF 65 to 70%.  Mild LVH.  GR 1 DD which for a 86 year old is normal for age.  Borderline Mild AS.  Mild AI.  Mildly elevated PAP.    Social History   Socioeconomic History   Marital status: Widowed    Spouse name: Not on file   Number of children: 1   Years of education: Not on file   Highest education level: Not on file  Occupational History   Occupation: retired    Fish farm manager: RETIRED  Tobacco Use   Smoking status: Never   Smokeless tobacco: Never  Vaping Use   Vaping Use: Never used   Substance and Sexual Activity   Alcohol use: Yes    Comment: rarely   Drug use: No   Sexual activity: Never  Other Topics Concern   Not on file  Social History Narrative   No diet         Social Determinants of Health   Financial Resource Strain: Low Risk    Difficulty of Paying Living Expenses: Not hard at all  Food Insecurity: No Food Insecurity   Worried About Charity fundraiser in the Last Year: Never true   [place] in the Last Year: Never true  Transportation Needs: No Transportation Needs   Lack of Transportation (Medical): No   Lack of Transportation (Non-Medical): No  Physical Activity: Sufficiently Active   Days of Exercise per Week: 5 days   Minutes of Exercise per Session: 30 min  Stress: No Stress Concern Present   Feeling of Stress : Not at all  Social Connections: Moderately Integrated   Frequency of Communication with Friends and Family: More than three times a week   Frequency of Social Gatherings with Friends and Family: More than three times a week   Attends Religious Services: More than 4 times per year   Active Member of [employer] or Organizations: Yes   Attends Archivist Meetings: More than 4 times per year   Marital Status: Widowed    Family History  Problem Relation Age of Onset   Arthritis Mother    Heart disease Brother        MI in 15's   Diabetes Maternal Grandmother    Heart disease Cousin        MI   Breast cancer Sister    Stroke Neg Hx     Review of Systems     Objective:  There were no vitals filed for this visit. BP Readings from Last 3 Encounters:  12/14/20 (!) 130/56  11/29/20 (!) 140/58  11/23/20 130/62   Wt Readings from Last 3 Encounters:  12/14/20 152 lb (68.9 kg)  11/29/20 154 lb 9.6 oz (70.1 kg)  11/23/20 150 lb (68 kg)   There is no height or weight on file to calculate BMI.   Physical Exam         Assessment & Plan:    See Problem List for Assessment and Plan of chronic medical  problems.

## 2021-05-14 ENCOUNTER — Ambulatory Visit: Payer: Medicare Other | Admitting: Internal Medicine

## 2021-05-19 DIAGNOSIS — Z23 Encounter for immunization: Secondary | ICD-10-CM | POA: Diagnosis not present

## 2021-06-14 ENCOUNTER — Emergency Department (HOSPITAL_COMMUNITY): Payer: Medicare Other

## 2021-06-14 ENCOUNTER — Encounter (HOSPITAL_COMMUNITY): Payer: Self-pay | Admitting: Emergency Medicine

## 2021-06-14 ENCOUNTER — Emergency Department (HOSPITAL_COMMUNITY)
Admission: EM | Admit: 2021-06-14 | Discharge: 2021-06-14 | Disposition: A | Discharge status: Home / Self Care | Payer: Medicare Other | Attending: Emergency Medicine | Admitting: Emergency Medicine

## 2021-06-14 ENCOUNTER — Other Ambulatory Visit: Payer: Self-pay

## 2021-06-14 DIAGNOSIS — I499 Cardiac arrhythmia, unspecified: Secondary | ICD-10-CM | POA: Diagnosis not present

## 2021-06-14 DIAGNOSIS — R0789 Other chest pain: Secondary | ICD-10-CM | POA: Diagnosis not present

## 2021-06-14 DIAGNOSIS — E612 Magnesium deficiency: Secondary | ICD-10-CM | POA: Diagnosis not present

## 2021-06-14 DIAGNOSIS — R778 Other specified abnormalities of plasma proteins: Secondary | ICD-10-CM | POA: Insufficient documentation

## 2021-06-14 DIAGNOSIS — R011 Cardiac murmur, unspecified: Secondary | ICD-10-CM | POA: Insufficient documentation

## 2021-06-14 DIAGNOSIS — R600 Localized edema: Secondary | ICD-10-CM | POA: Insufficient documentation

## 2021-06-14 DIAGNOSIS — Z79899 Other long term (current) drug therapy: Secondary | ICD-10-CM | POA: Diagnosis not present

## 2021-06-14 DIAGNOSIS — I4891 Unspecified atrial fibrillation: Secondary | ICD-10-CM | POA: Diagnosis not present

## 2021-06-14 DIAGNOSIS — I10 Essential (primary) hypertension: Secondary | ICD-10-CM | POA: Insufficient documentation

## 2021-06-14 DIAGNOSIS — R0902 Hypoxemia: Secondary | ICD-10-CM | POA: Diagnosis not present

## 2021-06-14 DIAGNOSIS — R079 Chest pain, unspecified: Secondary | ICD-10-CM | POA: Diagnosis not present

## 2021-06-14 DIAGNOSIS — Z7982 Long term (current) use of aspirin: Secondary | ICD-10-CM | POA: Insufficient documentation

## 2021-06-14 DIAGNOSIS — R790 Abnormal level of blood mineral: Secondary | ICD-10-CM

## 2021-06-14 LAB — BASIC METABOLIC PANEL
Anion gap: 6 (ref 5–15)
BUN: 20 mg/dL (ref 8–23)
CO2: 26 mmol/L (ref 22–32)
Calcium: 9.2 mg/dL (ref 8.9–10.3)
Chloride: 100 mmol/L (ref 98–111)
Creatinine, Ser: 1.07 mg/dL — ABNORMAL HIGH (ref 0.44–1.00)
GFR, Estimated: 48 mL/min — ABNORMAL LOW (ref >60)
Glucose, Bld: 98 mg/dL (ref 70–99)
Potassium: 3.9 mmol/L (ref 3.5–5.1)
Sodium: 132 mmol/L — ABNORMAL LOW (ref 135–145)

## 2021-06-14 LAB — CBC WITH DIFFERENTIAL/PLATELET
Abs Immature Granulocytes: 0.01 10*3/uL (ref 0.00–0.07)
Basophils Absolute: 0 10*3/uL (ref 0.0–0.1)
Basophils Relative: 1 %
Eosinophils Absolute: 0.1 10*3/uL (ref 0.0–0.5)
Eosinophils Relative: 2 %
HCT: 35.2 % — ABNORMAL LOW (ref 36.0–46.0)
Hemoglobin: 11.7 g/dL — ABNORMAL LOW (ref 12.0–15.0)
Immature Granulocytes: 0 %
Lymphocytes Relative: 40 %
Lymphs Abs: 2.5 10*3/uL (ref 0.7–4.0)
MCH: 32.1 pg (ref 26.0–34.0)
MCHC: 33.2 g/dL (ref 30.0–36.0)
MCV: 96.7 fL (ref 80.0–100.0)
Monocytes Absolute: 0.5 10*3/uL (ref 0.1–1.0)
Monocytes Relative: 8 %
Neutro Abs: 3 10*3/uL (ref 1.7–7.7)
Neutrophils Relative %: 49 %
Platelets: 161 10*3/uL (ref 150–400)
RBC: 3.64 MIL/uL — ABNORMAL LOW (ref 3.87–5.11)
RDW: 12.6 % (ref 11.5–15.5)
WBC: 6.1 10*3/uL (ref 4.0–10.5)
nRBC: 0 % (ref 0.0–0.2)

## 2021-06-14 LAB — BRAIN NATRIURETIC PEPTIDE: B Natriuretic Peptide: 99.2 pg/mL (ref 0.0–100.0)

## 2021-06-14 LAB — T4, FREE: Free T4: 1.11 ng/dL (ref 0.61–1.12)

## 2021-06-14 LAB — TSH: TSH: 1.908 u[IU]/mL (ref 0.350–4.500)

## 2021-06-14 LAB — TROPONIN I (HIGH SENSITIVITY)
Troponin I (High Sensitivity): 10 ng/L (ref <18)
Troponin I (High Sensitivity): 46 ng/L — ABNORMAL HIGH (ref <18)

## 2021-06-14 LAB — MAGNESIUM: Magnesium: 1.6 mg/dL — ABNORMAL LOW (ref 1.7–2.4)

## 2021-06-14 MED ORDER — MAGNESIUM CHLORIDE 64 MG PO TBEC
1.0000 | DELAYED_RELEASE_TABLET | Freq: Once | ORAL | Status: AC
  Administered 2021-06-14: 64 mg via ORAL
  Filled 2021-06-14: qty 1

## 2021-06-14 NOTE — ED Notes (Signed)
Patient denies pain and is resting comfortably. Pt denies chest pain, SHOB, nausea, headache, fatigue, or numbness

## 2021-06-14 NOTE — ED Triage Notes (Signed)
Patient from home, woke up at 2am with chest pain, achy and dull.  Patient took 4 baby ASA.  Patient found in Afib RVR rate of 120-160.  Patient was placed on oxygen, patient converted to NSR at 70.  Patient denies any chest pain at this time.

## 2021-06-14 NOTE — Discharge Instructions (Signed)
Your history, exam, work-up today are consistent with chest discomfort likely related to the fast heart rhythm you had at home according to EMS.  He did not have any significant arrhythmias or continued symptoms for over 8 hours during observation here.  Your work-up showed a slightly increased troponin or heart enzyme which was due to that increase in rate for several hours.  Otherwise your magnesium was slightly low but otherwise labs are reassuring.  X-ray was reassuring.  Given your stability, we felt it was reasonable to have you follow-up with both your primary doctor and your cardiologist with extremely strict return precautions.  If any symptoms are to change or worsen acutely, please return to the nearest emergency department.

## 2021-06-14 NOTE — ED Provider Notes (Signed)
Emergency Medicine Provider Triage Evaluation Note  Denise Wagner , a 87 y.o. female  was evaluated in triage.  Pt complains of chest pain that started 2am.  Took 4 ASA.  Was in AFIB with RVR 120's with EMS but converted after some IVF.  No longer having chest pain.  Review of Systems  Positive: Chest pain, AFIB Negative: Fever, cough  Physical Exam  BP 108/78 (BP Location: Right Arm)   Pulse 95   Temp 97.6 F (36.4 C) (Oral)   Resp 20   Ht 5\' 5"  (1.651 m)   Wt 70.3 kg   SpO2 100%   BMI 25.79 kg/m   Gen:   Awake, no distress   Resp:  Normal effort  MSK:   Moves extremities without difficulty  Other:    Medical Decision Making  Medically screening exam initiated at 5:48 AM.  Appropriate orders placed.  Denise Wagner was informed that the remainder of the evaluation will be completed by another provider, this initial triage assessment does not replace that evaluation, and the importance of remaining in the ED until their evaluation is complete.  New onset AFIB, converted and now NSR.  No active chest pain.  EKG,  labs including TSH/T4, CXR.   Denise Pickett, PA-C 06/20/21 0240    Denise Essex, MD 06/20/21 [phone]

## 2021-06-14 NOTE — ED Provider Notes (Signed)
[facility] EMERGENCY DEPARTMENT Provider Note   CSN: [id number] Arrival date & time: 06/14/21  0548     History Chief Complaint  Patient presents with   Atrial Fibrillation    Denise Wagner is a 87 y.o. female.  The history is provided by the patient and medical records (EMS report to nursing). No language interpreter was used.  Atrial Fibrillation This is a new problem. The current episode started 3 to 5 hours ago. The problem occurs rarely. The problem has been resolved. Associated symptoms include chest pain. Pertinent negatives include no abdominal pain, no headaches and no shortness of breath. Nothing aggravates the symptoms. Relieved by: oxygen and fluids. She has tried ASA for the symptoms.      Past Medical History:  Diagnosis Date   Hyperlipidemia    Hypertension    Mild anemia    Mild aortic stenosis    Mild tricuspid regurgitation    Osteopenia    Vitamin D deficiency     Patient Active Problem List   Diagnosis Date Noted   Dyspnea 11/29/2020   Mild aortic regurgitation 11/23/2020   Mild aortic stenosis 11/23/2020   Grade I diastolic dysfunction 50/03/3817   Bilateral leg edema 11/23/2020   Fatigue 11/22/2020   Bilateral primary osteoarthritis of knee 06/05/2020   Senile purpura ([place]) 06/05/2020   Nosebleed 02/28/2020   Cough 02/24/2017   Acute sinus infection 12/08/2016   Allergic rhinitis 12/08/2016   Vitamin D deficiency 10/25/2007   Hyperlipidemia 10/25/2007   Essential hypertension 10/25/2007   Osteopenia 08/18/2007    Past Surgical History:  Procedure Laterality Date   cataract surgery  2007   bilaterally   gravida 0,para 0,mis 0, Dr.Gottsegen     HEMORRHOID SURGERY     no colonoscopy     "I never had a problem" (SOC reviewed)   synvisc injections; r knee     Dr Lynann Bologna   TRANSTHORACIC ECHOCARDIOGRAM  05/2016   Normal EF 65 to 70%.  Mild LVH.  GR 1 DD which for a 87 year old is normal for age.  Borderline Mild AS.   Mild AI.  Mildly elevated PAP.     OB History   No obstetric history on file.     Family History  Problem Relation Age of Onset   Arthritis Mother    Heart disease Brother        MI in 56's   Diabetes Maternal Grandmother    Heart disease Cousin        MI   Breast cancer Sister    Stroke Neg Hx     Social History   Tobacco Use   Smoking status: Never   Smokeless tobacco: Never  Vaping Use   Vaping Use: Never used  Substance Use Topics   Alcohol use: Yes    Comment: rarely   Drug use: No    Home Medications Prior to Admission medications   Medication Sig Start Date End Date Taking? Authorizing Provider  amLODipine (NORVASC) 2.5 MG tablet TAKE 1 TABLET DAILY 03/20/21   Binnie Rail, MD  aspirin 81 MG tablet Take 81 mg by mouth daily.    [provider]  azithromycin (ZITHROMAX) 250 MG tablet Take two tabs the first day and then one tab daily for four days 05/03/21   Binnie Rail, MD  benzonatate (TESSALON) 100 MG capsule Take 1 capsule (100 mg total) by mouth 2 (two) times daily as needed for cough. 05/03/21   Billey Gosling  J, MD  CALCIUM-VITAMIN D PO Take 1 tablet by mouth daily.     [provider]  Cholecalciferol (VITAMIN D3) 1000 UNITS CAPS Take 1,000 mg by mouth daily.    [provider]  Ibuprofen (ADVIL PO) Take 1 tablet by mouth daily as needed (PAIN).    [provider]  L-Lysine 500 MG TABS Take 500 mg by mouth daily.    [provider]  loratadine (CLARITIN) 10 MG tablet Take 10 mg by mouth every evening.    [provider]  losartan-hydrochlorothiazide (HYZAAR) 100-25 MG tablet TAKE 1 TABLET DAILY 03/11/21   Binnie Rail, MD  metoprolol tartrate (LOPRESSOR) 25 MG tablet TAKE 1 TABLET TWICE A DAY 01/30/21   Burns, Claudina Lick, MD  montelukast (SINGULAIR) 10 MG tablet Take 1 tablet (10 mg total) by mouth at bedtime. 01/02/21   Binnie Rail, MD  Multiple Vitamins-Calcium (VIACTIV MULTI-VITAMIN PO) Take 2 tablets  by mouth daily.    [provider]  Omega-3 Fatty Acids (FISH OIL) 1000 MG CAPS Take 1,000 mg by mouth 2 (two) times daily.     [provider]  vitamin C (ASCORBIC ACID) 500 MG tablet Take 500 mg by mouth daily.     [provider]  vitamin E 180 MG (400 UNITS) capsule Take 400 Units by mouth daily.    [provider]  Zinc Acetate, Oral, (ZINC ACETATE PO) Take 1 tablet by mouth daily.    [provider]    Allergies    Doxycycline, Metronidazole, Penicillins, Sulfonamide derivatives, and Pravastatin  Review of Systems   Review of Systems  Constitutional:  Negative for chills, diaphoresis, fatigue and fever.  HENT:  Negative for congestion.   Eyes:  Negative for visual disturbance.  Respiratory:  Negative for cough, chest tightness, shortness of breath and wheezing.   Cardiovascular:  Positive for chest pain and leg swelling (chronic for months). Negative for palpitations.  Gastrointestinal:  Negative for abdominal pain, constipation, diarrhea, nausea and vomiting.  Genitourinary:  Negative for dysuria.  Musculoskeletal:  Negative for back pain, neck pain and neck stiffness.  Skin:  Negative for rash and wound.  Neurological:  Negative for weakness, light-headedness, numbness and headaches.  Psychiatric/Behavioral:  Negative for agitation and confusion.   All other systems reviewed and are negative.  Physical Exam Updated Vital Signs BP (!) 122/102 (BP Location: Right Arm)   Pulse 71   Temp 97.7 F (36.5 C) (Oral)   Resp 13   SpO2 100%   Physical Exam Vitals and nursing note reviewed.  Constitutional:      General: She is not in acute distress.    Appearance: Normal appearance. She is well-developed. She is not ill-appearing, toxic-appearing or diaphoretic.  HENT:     Head: Normocephalic and atraumatic.     Nose: Nose normal. No congestion or rhinorrhea.  Eyes:     Extraocular Movements: Extraocular movements intact.      Conjunctiva/sclera: Conjunctivae normal.     Pupils: Pupils are equal, round, and reactive to light.  Cardiovascular:     Rate and Rhythm: Normal rate and regular rhythm.     Heart sounds: Murmur heard.  Pulmonary:     Effort: Pulmonary effort is normal. No respiratory distress.     Breath sounds: Normal breath sounds. No wheezing, rhonchi or rales.  Chest:     Chest wall: No tenderness.  Abdominal:     General: Abdomen is flat.     Palpations: Abdomen  is soft.     Tenderness: There is no abdominal tenderness. There is no guarding or rebound.  Musculoskeletal:        General: No swelling or tenderness.     Cervical back: Neck supple. No tenderness.     Right lower leg: Edema present.     Left lower leg: Edema present.  Skin:    General: Skin is warm and dry.     Capillary Refill: Capillary refill takes less than 2 seconds.     Findings: No erythema.  Neurological:     General: No focal deficit present.     Mental Status: She is alert.  Psychiatric:        Mood and Affect: Mood normal.    ED Results / Procedures / Treatments   Labs (all labs ordered are listed, but only abnormal results are displayed) Labs Reviewed  CBC WITH DIFFERENTIAL/PLATELET - Abnormal; Notable for the following components:      Result Value   RBC 3.64 (*)    Hemoglobin 11.7 (*)    HCT 35.2 (*)    All other components within normal limits  BASIC METABOLIC PANEL - Abnormal; Notable for the following components:   Sodium 132 (*)    Creatinine, Ser 1.07 (*)    GFR, Estimated 48 (*)    All other components within normal limits  TSH  T4, FREE  BRAIN NATRIURETIC PEPTIDE  MAGNESIUM  TROPONIN I (HIGH SENSITIVITY)  TROPONIN I (HIGH SENSITIVITY)    EKG EKG Interpretation  Date/Time:  Friday June 14 2021 06:04:32 EST Ventricular Rate:  72 PR Interval:  170 QRS Duration: 96 QT Interval:  386 QTC Calculation: 422 R Axis:   -24 Text Interpretation: Normal sinus rhythm Moderate voltage criteria  for LVH, may be normal variant ( R in aVL , Cornell product ) Borderline ECG When compared to prior, similar appearance. No STEMI Confirmed by Antony Blackbird [phone]) on 06/14/2021 10:34:45 AM  Radiology DG Chest 2 View  Result Date: 06/14/2021 CLINICAL DATA:  Atrial fibrillation. EXAM: CHEST - 2 VIEW COMPARISON:  12/14/2020 FINDINGS: 0618 hours. The lungs are clear without focal pneumonia, edema, pneumothorax or pleural effusion. The cardiopericardial silhouette is within normal limits for size. Bones are diffusely demineralized. IMPRESSION: No active cardiopulmonary disease. Electronically Signed   By: Misty Stanley M.D.   On: 06/14/2021 06:41    Procedures Procedures   Medications Ordered in ED Medications  magnesium chloride (SLOW-MAG) 64 MG SR tablet 64 mg (has no administration in time range)    ED Course  I have reviewed the triage vital signs and the nursing notes.  Pertinent labs & imaging results that were available during my care of the patient were reviewed by me and considered in my medical decision making (see chart for details).    MDM Rules/Calculators/A&P                           TILA MILLIRONS is a 87 y.o. female with a past medical history significant for hypertension, mild aortic stenosis, hyperlipidemia, osteopenia, and anemia who presents with chest pain overnight.  According to EMS report to nursing, patient woke up around 2 AM with left sided chest pressure going to her left jaw and shoulder.  She reportedly took 4 baby aspirin and after not improved call for help.  She was brought in but EMS found her to have A. fib with RVR with a rate between 120 and 160.  They gave her some fluids and place her oxygen and then she converted to sinus rhythm.  Since arrival to the emergency department around 5 hours ago, she has been in sinus rhythm without any symptoms.  She does report that her legs have been edematous for the last few months but denies any new leg pain or any  unilateral symptoms.  No document history of DVT or PE.  She has chronic bilateral leg edema per the chart.  She denies any fevers, chills congestion, cough, nausea, vomiting, constipation, or diarrhea.  She denies any associated diaphoresis.  She reports he is able to walk around her house with the pain and did not seem to be exertional or pleuritic.  She denied palpitations with it.  She denies any history of A. fib.  On exam, lungs clear and chest nontender.  Slight murmur.  Abdomen nontender.  Good pulses in extremities.  Legs are edematous bilaterally but she reports they are similar to what they have been over months.  No other complaints reported or seen.  Patient had work-up started in triage including EKG and chest x-ray.  EKG does show sinus rhythm with no evidence of arrhythmia or STEMI.  Chest x-ray shows no pneumonia or fluid overload seen.  Patient had some work-up started including initial troponin that was normal and some thyroid testing that was normal.  BNP not elevated.  Given her stability for around 5 hours, we had a shared decision made conversation.  Patient agrees for a delta troponin and a magnesium level as I was not checked earlier.  If work-up is reassuring and she still appears well, anticipate discharge with plans to follow-up with a cardiologist given her well appearance and stability at this time.  2:03 PM Troponin was more elevated and magnesium was slightly low.  I had a shared decision-making conversation with patient and family and they would like to go home.  They are going to call her cardiologist today and PCP to get seen next week and observe extremely strict return precautions for any new or worsened symptoms.  We offered consultation with cardiology however given her stability for over 8 hours now without any recurrent symptoms and her overall well appearance, we feel it is reasonable to let her go home.  Patient likely had some mild demand ischemia in the  setting of the A. fib with RVR that was reported by EMS although she did not have an EKG showing that here.  Family and patient had no other questions or concerns and patient discharged in good condition with improved symptoms.   Final Clinical Impression(s) / ED Diagnoses Final diagnoses:  Chest pain, unspecified type  Elevated troponin  Low magnesium level   Clinical Impression: 1. Chest pain, unspecified type   2. Elevated troponin   3. Low magnesium level     Disposition: Discharge  Condition: Good  I have discussed the results, Dx and Tx plan with the pt(& family if present). He/she/they expressed understanding and agree(s) with the plan. Discharge instructions discussed at great length. Strict return precautions discussed and pt &/or family have verbalized understanding of the instructions. No further questions at time of discharge.    New Prescriptions   No medications on file    Follow Up: Binnie Rail, MD [place] [address] [phone]     [facility] [place] [id number] [place],  Gwenyth Allegra, MD 06/14/21 [phone]

## 2021-06-25 ENCOUNTER — Encounter: Payer: Self-pay | Admitting: Internal Medicine

## 2021-06-25 DIAGNOSIS — M1711 Unilateral primary osteoarthritis, right knee: Secondary | ICD-10-CM | POA: Diagnosis not present

## 2021-06-25 DIAGNOSIS — I4891 Unspecified atrial fibrillation: Secondary | ICD-10-CM | POA: Insufficient documentation

## 2021-06-25 NOTE — Patient Instructions (Addendum)
   Start wearing compression socks - start with light compression Follow up with Dr Ellyn Hack   Medications changes include :   start magnesium 300 mg daily.  Take the dramamine as needed.    Your prescription(s) have been submitted to your pharmacy. Please take as directed and contact our office if you believe you are having problem(s) with the medication(s).     Please followup in 6 months

## 2021-06-25 NOTE — Progress Notes (Signed)
Subjective:    Patient ID: Denise Wagner, female    DOB: 19-Feb-1924, 87 y.o.   MRN: [id number]  This visit occurred during the SARS-CoV-2 public health emergency.  Safety protocols were in place, including screening questions prior to the visit, additional usage of staff PPE, and extensive cleaning of exam room while observing appropriate contact time as indicated for disinfecting solutions.     HPI The patient is here for follow up of their chronic medical problems, including htn, leg edema, allergic rhinitis  She went to the Ed 11/18 for chest pain - jaw pain and was diagnosed with new onset Afib.  She converted to sinus in the ED.  She had blood work done at that time.  Her magnesium level was slightly low.  She denies having any chest pain or palpitations since the emergency room.  Leg swelling is chronic but worse recently.  Her swelling improves overnight.  She has not been wearing compression socks.    Dizziness - when she looks up.  No other movement or change in position causes dizziness.  She has been taking half of a nondrowsy Dramamine every morning and that has helped control her symptoms.  Medications and allergies reviewed with patient and updated if appropriate.  Patient Active Problem List   Diagnosis Date Noted   A-fib ([place]) 06/25/2021   Dyspnea 11/29/2020   Mild aortic regurgitation 11/23/2020   Mild aortic stenosis 11/23/2020   Grade I diastolic dysfunction 07/30/7251   Bilateral leg edema 11/23/2020   Fatigue 11/22/2020   Bilateral primary osteoarthritis of knee 06/05/2020   Senile purpura ([place]) 06/05/2020   Nosebleed 02/28/2020   Cough 02/24/2017   Acute sinus infection 12/08/2016   Allergic rhinitis 12/08/2016   Vitamin D deficiency 10/25/2007   Hyperlipidemia 10/25/2007   Essential hypertension 10/25/2007   Osteopenia 08/18/2007    Current Outpatient Medications on File Prior to Visit  Medication Sig Dispense Refill   amLODipine (NORVASC)  2.5 MG tablet TAKE 1 TABLET DAILY 90 tablet 3   aspirin 81 MG tablet Take 81 mg by mouth daily.     CALCIUM-VITAMIN D PO Take 1 tablet by mouth daily.      Cholecalciferol (VITAMIN D3) 1000 UNITS CAPS Take 1,000 mg by mouth daily.     Ibuprofen (ADVIL PO) Take 1 tablet by mouth daily as needed (PAIN).     L-Lysine 500 MG TABS Take 500 mg by mouth daily.     loratadine (CLARITIN) 10 MG tablet Take 10 mg by mouth every evening.     losartan-hydrochlorothiazide (HYZAAR) 100-25 MG tablet TAKE 1 TABLET DAILY 90 tablet 3   metoprolol tartrate (LOPRESSOR) 25 MG tablet TAKE 1 TABLET TWICE A DAY 180 tablet 3   montelukast (SINGULAIR) 10 MG tablet Take 1 tablet (10 mg total) by mouth at bedtime. 90 tablet 2   Multiple Vitamins-Calcium (VIACTIV MULTI-VITAMIN PO) Take 2 tablets by mouth daily.     Omega-3 Fatty Acids (FISH OIL) 1000 MG CAPS Take 1,000 mg by mouth 2 (two) times daily.      vitamin C (ASCORBIC ACID) 500 MG tablet Take 500 mg by mouth daily.      vitamin E 180 MG (400 UNITS) capsule Take 400 Units by mouth daily.     Zinc Acetate, Oral, (ZINC ACETATE PO) Take 1 tablet by mouth daily.     No current facility-administered medications on file prior to visit.    Past Medical History:  Diagnosis Date  Hyperlipidemia    Hypertension    Mild anemia    Mild aortic stenosis    Mild tricuspid regurgitation    Osteopenia    Vitamin D deficiency     Past Surgical History:  Procedure Laterality Date   cataract surgery  2007   bilaterally   gravida 0,para 0,mis 0, Dr.Gottsegen     HEMORRHOID SURGERY     no colonoscopy     "I never had a problem" (SOC reviewed)   synvisc injections; r knee     Dr Lynann Bologna   TRANSTHORACIC ECHOCARDIOGRAM  05/2016   Normal EF 65 to 70%.  Mild LVH.  GR 1 DD which for a 87 year old is normal for age.  Borderline Mild AS.  Mild AI.  Mildly elevated PAP.    Social History   Socioeconomic History   Marital status: Widowed    Spouse name: Not on file    Number of children: 1   Years of education: Not on file   Highest education level: Not on file  Occupational History   Occupation: retired    Fish farm manager: RETIRED  Tobacco Use   Smoking status: Never   Smokeless tobacco: Never  Vaping Use   Vaping Use: Never used  Substance and Sexual Activity   Alcohol use: Yes    Comment: rarely   Drug use: No   Sexual activity: Never  Other Topics Concern   Not on file  Social History Narrative   No diet         Social Determinants of Health   Financial Resource Strain: Not on file  Food Insecurity: Not on file  Transportation Needs: Not on file  Physical Activity: Not on file  Stress: Not on file  Social Connections: Not on file    Family History  Problem Relation Age of Onset   Arthritis Mother    Heart disease Brother        MI in 16's   Diabetes Maternal Grandmother    Heart disease Cousin        MI   Breast cancer Sister    Stroke Neg Hx     Review of Systems  Constitutional:  Negative for chills and fever.  Respiratory:  Negative for cough, shortness of breath and wheezing.   Cardiovascular:  Positive for leg swelling. Negative for chest pain and palpitations.  Neurological:  Negative for light-headedness and headaches.      Objective:   Vitals:   06/26/21 1303  BP: 124/60  Pulse: 65  Temp: 98.2 F (36.8 C)  SpO2: 96%   BP Readings from Last 3 Encounters:  06/26/21 124/60  06/14/21 108/78  12/14/20 (!) 130/56   Wt Readings from Last 3 Encounters:  06/26/21 158 lb (71.7 kg)  06/14/21 155 lb (70.3 kg)  12/14/20 152 lb (68.9 kg)   Body mass index is 26.29 kg/m.   Physical Exam    Constitutional: Appears well-developed and well-nourished. No distress.  HENT:  Head: Normocephalic and atraumatic.  Neck: Neck supple. No tracheal deviation present. No thyromegaly present.  No cervical lymphadenopathy Cardiovascular: Normal rate, regular rhythm and normal heart sounds.  2/6 sys murmur heard. No carotid  bruit .  2 + b/l LE edema Pulmonary/Chest: Effort normal and breath sounds normal. No respiratory distress. No has no wheezes. No rales.  Skin: Skin is warm and dry. Not diaphoretic.  Psychiatric: Normal mood and affect. Behavior is normal.      Assessment & Plan:    See Problem  List for Assessment and Plan of chronic medical problems.

## 2021-06-26 ENCOUNTER — Ambulatory Visit (INDEPENDENT_AMBULATORY_CARE_PROVIDER_SITE_OTHER): Payer: Medicare Other | Admitting: Internal Medicine

## 2021-06-26 ENCOUNTER — Other Ambulatory Visit: Payer: Self-pay

## 2021-06-26 VITALS — BP 124/60 | HR 65 | Temp 98.2°F | Ht 65.0 in | Wt 158.0 lb

## 2021-06-26 DIAGNOSIS — R600 Localized edema: Secondary | ICD-10-CM | POA: Diagnosis not present

## 2021-06-26 DIAGNOSIS — J301 Allergic rhinitis due to pollen: Secondary | ICD-10-CM

## 2021-06-26 DIAGNOSIS — I10 Essential (primary) hypertension: Secondary | ICD-10-CM | POA: Diagnosis not present

## 2021-06-26 DIAGNOSIS — I480 Paroxysmal atrial fibrillation: Secondary | ICD-10-CM

## 2021-06-26 MED ORDER — MAGNESIUM 300 MG PO CAPS: 300.0000 mg | ORAL_CAPSULE | Freq: Every day | ORAL | 3 refills | Status: AC

## 2021-06-26 MED ORDER — MECLIZINE HCL 12.5 MG PO TABS: 12.5000 mg | ORAL_TABLET | Freq: Three times a day (TID) | ORAL | 0 refills | Status: AC | PRN

## 2021-06-26 NOTE — Assessment & Plan Note (Signed)
Chronic Controlled, stable Continue Singulair 10 mg nightly

## 2021-06-26 NOTE — Assessment & Plan Note (Addendum)
Chronic Blood pressure well controlled Continue amlodipine 2.5 mg daily, losartan-hydrochlorothiazide 100-25 mg daily, metoprolol 25 mg twice daily

## 2021-06-26 NOTE — Assessment & Plan Note (Signed)
Chronic 2+ edema on exam She does elevate her legs when sitting Stressed the importance of trying compression hose-even light compression to see if that helps Currently on hydrochlorothiazide 25 mg daily-would like to avoid switching this to Lasix if possible Low-sodium diet

## 2021-06-26 NOTE — Assessment & Plan Note (Signed)
New Magnesium level was low in the ED earlier this month when she had the episode of atrial fibrillation She did receive 1 dose of magnesium Will start magnesium 300 mg daily Advised follow-up with cardiology and they can reevaluate

## 2021-06-26 NOTE — Assessment & Plan Note (Signed)
New Had 1 brief episode of atrial fibrillation 06/14/2021-woke up at night with chest pain, left-sided jaw pain and when EMS arrived she was in atrial fibrillation-converted back to sinus spontaneously in the ED Denies chest pain or palpitation since then In sinus rhythm here today Has echocardiogram scheduled by cardiology Advised follow-up with Dr. Ellyn Hack Continue 81 mg of aspirin Heart rate well controlled here today

## 2021-06-28 ENCOUNTER — Ambulatory Visit: Payer: Medicare Other | Admitting: Internal Medicine

## 2021-06-29 ENCOUNTER — Emergency Department (HOSPITAL_BASED_OUTPATIENT_CLINIC_OR_DEPARTMENT_OTHER)
Admission: EM | Admit: 2021-06-29 | Discharge: 2021-06-29 | Disposition: A | Discharge status: Home / Self Care | Payer: Medicare Other | Attending: Emergency Medicine | Admitting: Emergency Medicine

## 2021-06-29 ENCOUNTER — Emergency Department (HOSPITAL_BASED_OUTPATIENT_CLINIC_OR_DEPARTMENT_OTHER): Payer: Medicare Other

## 2021-06-29 ENCOUNTER — Encounter (HOSPITAL_BASED_OUTPATIENT_CLINIC_OR_DEPARTMENT_OTHER): Payer: Self-pay

## 2021-06-29 ENCOUNTER — Other Ambulatory Visit: Payer: Self-pay

## 2021-06-29 DIAGNOSIS — I10 Essential (primary) hypertension: Secondary | ICD-10-CM | POA: Diagnosis not present

## 2021-06-29 DIAGNOSIS — M25511 Pain in right shoulder: Secondary | ICD-10-CM | POA: Insufficient documentation

## 2021-06-29 DIAGNOSIS — M25531 Pain in right wrist: Secondary | ICD-10-CM | POA: Diagnosis not present

## 2021-06-29 DIAGNOSIS — W01198A Fall on same level from slipping, tripping and stumbling with subsequent striking against other object, initial encounter: Secondary | ICD-10-CM | POA: Diagnosis not present

## 2021-06-29 DIAGNOSIS — S0993XA Unspecified injury of face, initial encounter: Secondary | ICD-10-CM | POA: Diagnosis present

## 2021-06-29 DIAGNOSIS — S01111A Laceration without foreign body of right eyelid and periocular area, initial encounter: Secondary | ICD-10-CM | POA: Diagnosis not present

## 2021-06-29 DIAGNOSIS — Z79899 Other long term (current) drug therapy: Secondary | ICD-10-CM | POA: Diagnosis not present

## 2021-06-29 DIAGNOSIS — M19031 Primary osteoarthritis, right wrist: Secondary | ICD-10-CM | POA: Diagnosis not present

## 2021-06-29 DIAGNOSIS — Z7982 Long term (current) use of aspirin: Secondary | ICD-10-CM | POA: Insufficient documentation

## 2021-06-29 DIAGNOSIS — S0990XA Unspecified injury of head, initial encounter: Secondary | ICD-10-CM | POA: Diagnosis not present

## 2021-06-29 DIAGNOSIS — G8911 Acute pain due to trauma: Secondary | ICD-10-CM | POA: Diagnosis not present

## 2021-06-29 DIAGNOSIS — W19XXXA Unspecified fall, initial encounter: Secondary | ICD-10-CM

## 2021-06-29 DIAGNOSIS — S0181XA Laceration without foreign body of other part of head, initial encounter: Secondary | ICD-10-CM | POA: Diagnosis not present

## 2021-06-29 DIAGNOSIS — S199XXA Unspecified injury of neck, initial encounter: Secondary | ICD-10-CM | POA: Diagnosis not present

## 2021-06-29 DIAGNOSIS — Z043 Encounter for examination and observation following other accident: Secondary | ICD-10-CM | POA: Diagnosis not present

## 2021-06-29 NOTE — ED Notes (Signed)
Dc instructions reviewed with patient. Patient voiced understanding. Dc with belongings.  °

## 2021-06-29 NOTE — ED Triage Notes (Signed)
Patient here POV from Home from [place] yesterday PM.  Patient tripped over her [place] in her Home and hit her Head on Wooden Table.   Patient has Bruising and 3-4 cm Laceration above Right Orbit. Pain to Right Shoulder, Right Ankle, and Lower Back.  NAD Noted during Triage. BIB Wheelchair. A&Ox4. GCS 15. Patient takes 16 ASA Daily; no Other Blood Thinning Medications known.

## 2021-06-29 NOTE — Discharge Instructions (Signed)
Your head CT did not show any evidence of acute abnormality It does show chronic sinusitis Please keep your wound clean and dry.  If it shows any signs of infection such as increased redness, discharge, or worsening pain please be reevaluated.

## 2021-06-29 NOTE — ED Notes (Signed)
Steri strips applied to right eyebrow.

## 2021-06-29 NOTE — ED Provider Notes (Signed)
[facility] EMERGENCY DEPT Provider Note   CSN: [id number] Arrival date & time: 06/29/21  1051     History Chief Complaint  Patient presents with   Denise Wagner is a 87 y.o. female.  HPI 87 year old female who lives independently presents today complaining of fall yesterday.  She states she had a rubber soled shoes and tripped and fell and struck her head.  She denies loss of consciousness.  She is complaining of some pain in her right wrist and shoulder.  She has some back pain.  No numbness, tingling, weakness.  She is not on blood thinners.  She does have a history of paroxysmal A. fib.     Past Medical History:  Diagnosis Date   Hyperlipidemia    Hypertension    Mild anemia    Mild aortic stenosis    Mild tricuspid regurgitation    Osteopenia    Vitamin D deficiency     Patient Active Problem List   Diagnosis Date Noted   Hypomagnesemia 06/26/2021   A-fib ([place]) 06/25/2021   Dyspnea 11/29/2020   Mild aortic regurgitation 11/23/2020   Mild aortic stenosis 11/23/2020   Grade I diastolic dysfunction 95/62/1308   Bilateral leg edema 11/23/2020   Fatigue 11/22/2020   Bilateral primary osteoarthritis of knee 06/05/2020   Senile purpura ([place]) 06/05/2020   Nosebleed 02/28/2020   Cough 02/24/2017   Allergic rhinitis 12/08/2016   Vitamin D deficiency 10/25/2007   Hyperlipidemia 10/25/2007   Essential hypertension 10/25/2007   Osteopenia 08/18/2007    Past Surgical History:  Procedure Laterality Date   cataract surgery  2007   bilaterally   gravida 0,para 0,mis 0, Dr.Gottsegen     HEMORRHOID SURGERY     no colonoscopy     "I never had a problem" (SOC reviewed)   synvisc injections; r knee     Dr Lynann Bologna   TRANSTHORACIC ECHOCARDIOGRAM  05/2016   Normal EF 65 to 70%.  Mild LVH.  GR 1 DD which for a 87 year old is normal for age.  Borderline Mild AS.  Mild AI.  Mildly elevated PAP.     OB History   No obstetric history on file.      Family History  Problem Relation Age of Onset   Arthritis Mother    Heart disease Brother        MI in 42's   Diabetes Maternal Grandmother    Heart disease Cousin        MI   Breast cancer Sister    Stroke Neg Hx     Social History   Tobacco Use   Smoking status: Never   Smokeless tobacco: Never  Vaping Use   Vaping Use: Never used  Substance Use Topics   Alcohol use: Yes    Comment: rarely   Drug use: No    Home Medications Prior to Admission medications   Medication Sig Start Date End Date Taking? Authorizing Provider  amLODipine (NORVASC) 2.5 MG tablet TAKE 1 TABLET DAILY 03/20/21   Binnie Rail, MD  aspirin 81 MG tablet Take 81 mg by mouth daily.    [provider]  CALCIUM-VITAMIN D PO Take 1 tablet by mouth daily.     [provider]  Cholecalciferol (VITAMIN D3) 1000 UNITS CAPS Take 1,000 mg by mouth daily.    [provider]  Ibuprofen (ADVIL PO) Take 1 tablet by mouth daily as needed (PAIN).    [provider]  L-Lysine 500  MG TABS Take 500 mg by mouth daily.    [provider]  loratadine (CLARITIN) 10 MG tablet Take 10 mg by mouth every evening.    [provider]  losartan-hydrochlorothiazide (HYZAAR) 100-25 MG tablet TAKE 1 TABLET DAILY 03/11/21   Binnie Rail, MD  Magnesium 300 MG CAPS Take 1 capsule (300 mg total) by mouth daily. 06/26/21   Binnie Rail, MD  meclizine (ANTIVERT) 12.5 MG tablet Take 1 tablet (12.5 mg total) by mouth 3 (three) times daily as needed for dizziness. 06/26/21   Binnie Rail, MD  metoprolol tartrate (LOPRESSOR) 25 MG tablet TAKE 1 TABLET TWICE A DAY 01/30/21   Burns, Claudina Lick, MD  montelukast (SINGULAIR) 10 MG tablet Take 1 tablet (10 mg total) by mouth at bedtime. 01/02/21   Binnie Rail, MD  Multiple Vitamins-Calcium (VIACTIV MULTI-VITAMIN PO) Take 2 tablets by mouth daily.    [provider]  Omega-3 Fatty Acids (FISH OIL) 1000 MG CAPS Take 1,000 mg by mouth 2  (two) times daily.     [provider]  vitamin C (ASCORBIC ACID) 500 MG tablet Take 500 mg by mouth daily.     [provider]  vitamin E 180 MG (400 UNITS) capsule Take 400 Units by mouth daily.    [provider]  Zinc Acetate, Oral, (ZINC ACETATE PO) Take 1 tablet by mouth daily.    [provider]    Allergies    Doxycycline, Metronidazole, Penicillins, Sulfonamide derivatives, and Pravastatin  Review of Systems   Review of Systems  All other systems reviewed and are negative.  Physical Exam Updated Vital Signs BP (!) 150/71   Pulse 67   Temp 98 F (36.7 C) (Oral)   Resp 16   Ht 1.651 m (5\' 5" )   Wt 71.7 kg   SpO2 98%   BMI 26.30 kg/m   Physical Exam Vitals and nursing note reviewed.  Constitutional:      General: She is not in acute distress.    Appearance: She is well-developed.  HENT:     Head: Normocephalic.     Comments: 3 cm laceration lateral to right eye.    Right Ear: External ear normal.     Left Ear: External ear normal.     Nose: Nose normal.     Mouth/Throat:     Mouth: Mucous membranes are moist.  Eyes:     Conjunctiva/sclera: Conjunctivae normal.     Pupils: Pupils are equal, round, and reactive to light.  Cardiovascular:     Rate and Rhythm: Normal rate and regular rhythm.  Pulmonary:     Effort: Pulmonary effort is normal.  Abdominal:     General: Abdomen is flat.     Palpations: Abdomen is soft.  Musculoskeletal:        General: Normal range of motion.     Cervical back: Normal range of motion and neck supple.     Comments: Neck and back examined with no obvious external signs of trauma and no point tenderness noted over cervical, thoracic, or lumbar vertebrae.  Skin:    General: Skin is warm and dry.  Neurological:     Mental Status: She is alert and oriented to person, place, and time.     Motor: No abnormal muscle tone.     Coordination: Coordination normal.  Psychiatric:        Behavior:  Behavior normal.        Thought Content: Thought content  normal.    ED Results / Procedures / Treatments   Labs (all labs ordered are listed, but only abnormal results are displayed) Labs Reviewed - No data to display  EKG None  Radiology No results found.  Procedures Procedures   Medications Ordered in ED Medications - No data to display  ED Course  I have reviewed the triage vital signs and the nursing notes.  Pertinent labs & imaging results that were available during my care of the patient were reviewed by me and considered in my medical decision making (see chart for details).    MDM Rules/Calculators/A&P                         87 year old female with reports of mechanical fall yesterday.  She struck her head and had pain in her right wrist and shoulder.  She had some low back pain.  Over there is no point tenderness noted.  CT of the head shows no evidence of acute abnormality although she does have sinusitis.  Her daughter endorses that she has had chronic sinusitis throughout her life.  This does not appear to be any new issues.  No acute fractures are noted on shoulder or wrist x-Donjuan Robison.  Patient has been up and ambulatory.  Her right periorbital laceration appears to be well-healing.  She cleaned and put antibiotic ointment on it at home.  Does not appear to need his sutures.  We will put some Steri-Strips on it.  Patient appears stable for discharge home Final Clinical Impression(s) / ED Diagnoses Final diagnoses:  Fall, initial encounter  Facial laceration, initial encounter    Rx / DC Orders ED Discharge Orders     None        Pattricia Boss, MD 07/02/21 1145

## 2021-07-02 DIAGNOSIS — M546 Pain in thoracic spine: Secondary | ICD-10-CM | POA: Diagnosis not present

## 2021-07-02 DIAGNOSIS — M1711 Unilateral primary osteoarthritis, right knee: Secondary | ICD-10-CM | POA: Diagnosis not present

## 2021-07-02 DIAGNOSIS — S32000A Wedge compression fracture of unspecified lumbar vertebra, initial encounter for closed fracture: Secondary | ICD-10-CM | POA: Diagnosis not present

## 2021-07-03 ENCOUNTER — Other Ambulatory Visit: Payer: Self-pay

## 2021-07-03 ENCOUNTER — Ambulatory Visit (INDEPENDENT_AMBULATORY_CARE_PROVIDER_SITE_OTHER): Payer: Medicare Other

## 2021-07-03 DIAGNOSIS — Z0000 Encounter for general adult medical examination without abnormal findings: Secondary | ICD-10-CM | POA: Diagnosis not present

## 2021-07-03 NOTE — Progress Notes (Signed)
I connected with Denise Wagner today by telephone and verified that I am speaking with the correct person using two identifiers. Location patient: home Location provider: work Persons participating in the virtual visit: patient, provider.   I discussed the limitations, risks, security and privacy concerns of performing an evaluation and management service by telephone and the availability of in person appointments. I also discussed with the patient that there may be a patient responsible charge related to this service. The patient expressed understanding and verbally consented to this telephonic visit.    Interactive audio and video telecommunications were attempted between this provider and patient, however failed, due to patient having technical difficulties OR patient did not have access to video capability.  We continued and completed visit with audio only.  Some vital signs may be absent or patient reported.   Time Spent with patient on telephone encounter: 40 minutes  Subjective:   Denise Wagner is a 87 y.o. female who presents for Medicare Annual (Subsequent) preventive examination.  Review of Systems     Cardiac Risk Factors include: advanced age (>93men, >21 women);family history of premature cardiovascular disease;dyslipidemia;hypertension     Objective:    Today's Vitals   07/03/21 1350  PainSc: 6    There is no height or weight on file to calculate BMI.  Advanced Directives 07/03/2021 06/29/2021 06/14/2021 05/10/2020 05/05/2019 04/29/2018 06/17/2016  Does Patient Have a Medical Advance Directive? No No No Yes Yes Yes -  Type of Advance Directive - - Public librarian;Living will [place];Living will [place];Living will Living will  Does patient want to make changes to medical advance directive? - - - No - Patient declined - - -  Copy of [place] in Chart? - - - No - copy requested No - copy  requested No - copy requested -  Would patient like information on creating a medical advance directive? No - Patient declined No - Patient declined No - Patient declined - - - No - Patient declined    Current Medications (verified) Outpatient Encounter Medications as of 07/03/2021  Medication Sig   amLODipine (NORVASC) 2.5 MG tablet TAKE 1 TABLET DAILY   aspirin 81 MG tablet Take 81 mg by mouth daily.   CALCIUM-VITAMIN D PO Take 1 tablet by mouth daily.    Cholecalciferol (VITAMIN D3) 1000 UNITS CAPS Take 1,000 mg by mouth daily.   Ibuprofen (ADVIL PO) Take 1 tablet by mouth daily as needed (PAIN).   L-Lysine 500 MG TABS Take 500 mg by mouth daily.   loratadine (CLARITIN) 10 MG tablet Take 10 mg by mouth every evening.   losartan-hydrochlorothiazide (HYZAAR) 100-25 MG tablet TAKE 1 TABLET DAILY   Magnesium 300 MG CAPS Take 1 capsule (300 mg total) by mouth daily.   meclizine (ANTIVERT) 12.5 MG tablet Take 1 tablet (12.5 mg total) by mouth 3 (three) times daily as needed for dizziness.   metoprolol tartrate (LOPRESSOR) 25 MG tablet TAKE 1 TABLET TWICE A DAY   montelukast (SINGULAIR) 10 MG tablet Take 1 tablet (10 mg total) by mouth at bedtime.   Multiple Vitamins-Calcium (VIACTIV MULTI-VITAMIN PO) Take 2 tablets by mouth daily.   Omega-3 Fatty Acids (FISH OIL) 1000 MG CAPS Take 1,000 mg by mouth 2 (two) times daily.    vitamin C (ASCORBIC ACID) 500 MG tablet Take 500 mg by mouth daily.    vitamin E 180 MG (400 UNITS) capsule Take 400 Units by mouth  daily.   Zinc Acetate, Oral, (ZINC ACETATE PO) Take 1 tablet by mouth daily.   No facility-administered encounter medications on file as of 07/03/2021.    Allergies (verified) Doxycycline, Metronidazole, Penicillins, Sulfonamide derivatives, and Pravastatin   History: Past Medical History:  Diagnosis Date   Hyperlipidemia    Hypertension    Mild anemia    Mild aortic stenosis    Mild tricuspid regurgitation    Osteopenia    Vitamin D  deficiency    Past Surgical History:  Procedure Laterality Date   cataract surgery  2007   bilaterally   gravida 0,para 0,mis 0, Dr.Gottsegen     HEMORRHOID SURGERY     no colonoscopy     "I never had a problem" (SOC reviewed)   synvisc injections; r knee     Dr Lynann Bologna   TRANSTHORACIC ECHOCARDIOGRAM  05/2016   Normal EF 65 to 70%.  Mild LVH.  GR 1 DD which for a 87 year old is normal for age.  Borderline Mild AS.  Mild AI.  Mildly elevated PAP.   Family History  Problem Relation Age of Onset   Arthritis Mother    Heart disease Brother        MI in 60's   Diabetes Maternal Grandmother    Heart disease Cousin        MI   Breast cancer Sister    Stroke Neg Hx    Social History   Socioeconomic History   Marital status: Widowed    Spouse name: Not on file   Number of children: 1   Years of education: Not on file   Highest education level: Not on file  Occupational History   Occupation: retired    Fish farm manager: RETIRED  Tobacco Use   Smoking status: Never   Smokeless tobacco: Never  Vaping Use   Vaping Use: Never used  Substance and Sexual Activity   Alcohol use: Yes    Comment: rarely   Drug use: No   Sexual activity: Never  Other Topics Concern   Not on file  Social History Narrative   No diet         Social Determinants of Health   Financial Resource Strain: Low Risk    Difficulty of Paying Living Expenses: Not hard at all  Food Insecurity: No Food Insecurity   Worried About Charity fundraiser in the Last Year: Never true   [place] in the Last Year: Never true  Transportation Needs: No Transportation Needs   Lack of Transportation (Medical): No   Lack of Transportation (Non-Medical): No  Physical Activity: Sufficiently Active   Days of Exercise per Week: 5 days   Minutes of Exercise per Session: 30 min  Stress: No Stress Concern Present   Feeling of Stress : Not at all  Social Connections: Moderately Integrated   Frequency of Communication with  Friends and Family: More than three times a week   Frequency of Social Gatherings with Friends and Family: More than three times a week   Attends Religious Services: More than 4 times per year   Active Member of [employer] or Organizations: Yes   Attends Archivist Meetings: More than 4 times per year   Marital Status: Widowed    Tobacco Counseling Counseling given: Not Answered   Clinical Intake:  Pre-visit preparation completed: Yes  Pain : No/denies pain Pain Score: 6  Pain Type: Acute pain Pain Location: Back     Nutritional Risks: None Diabetes:  No  How often do you need to have someone help you when you read instructions, pamphlets, or other written materials from your doctor or pharmacy?: 1 - Never What is the last grade level you completed in school?: High School Graduate  Diabetic? No   Interpreter Needed?: No  Information entered by :: Lisette Abu, LPN   Activities of Daily Living In your present state of health, do you have any difficulty performing the following activities: 07/03/2021  Hearing? N  Vision? N  Difficulty concentrating or making decisions? N  Walking or climbing stairs? N  Dressing or bathing? N  Doing errands, shopping? N  Preparing Food and eating ? N  Using the Toilet? N  In the past six months, have you accidently leaked urine? N  Do you have problems with loss of bowel control? N  Managing your Medications? N  Managing your Finances? N  Housekeeping or managing your Housekeeping? N  Some recent data might be hidden    Patient Care Team: Binnie Rail, MD as PCP - General (Internal Medicine) Leonie Man, MD as PCP - Cardiology (Cardiology) Luberta Mutter, MD as Consulting Physician (Ophthalmology)  Indicate any recent Medical Services you may have received from other than [facility] providers in the past year (date may be approximate).     Assessment:   This is a routine wellness examination for  [place].  Hearing/Vision screen Hearing Screening - Comments:: Patient denied any hearing difficulty.   No hearing aids.  Vision Screening - Comments:: Patient wears corrective glasses/contacts.  Eye exam done annually by:   Dietary issues and exercise activities discussed: Current Exercise Habits: Home exercise routine, Type of exercise: walking, Time (Minutes): 30, Frequency (Times/Week): 5, Weekly Exercise (Minutes/Week): 150, Intensity: Mild, Exercise limited by: orthopedic condition(s)   Goals Addressed   None   Depression Screen PHQ 2/9 Scores 07/03/2021 05/10/2020 05/05/2019 12/28/2018 04/29/2018 08/29/2014 08/26/2013  PHQ - 2 Score 0 0 0 0 0 0 0  PHQ- 9 Score - - 0 - - - -    Fall Risk Fall Risk  07/03/2021 06/26/2021 05/10/2020 05/05/2019 12/28/2018  Falls in the past year? 1 0 0 0 0  Comment - - - - -  Number falls in past yr: 0 0 0 0 0  Injury with Fall? 1 0 0 0 -  Risk for fall due to : - No Fall Risks No Fall Risks Impaired balance/gait;Impaired mobility -  Follow up Falls prevention discussed Falls evaluation completed Falls evaluation completed;Education provided - -    FALL RISK PREVENTION PERTAINING TO THE HOME:  Any stairs in or around the home? No  If so, are there any without handrails? No  Home free of loose throw rugs in walkways, pet beds, electrical cords, etc? Yes  Adequate lighting in your home to reduce risk of falls? Yes   ASSISTIVE DEVICES UTILIZED TO PREVENT FALLS:  Life alert? No  Use of a cane, walker or w/c? No  Grab bars in the bathroom? Yes  Shower chair or bench in shower? No  Elevated toilet seat or a handicapped toilet? Yes   TIMED UP AND GO:  Was the test performed? No .  Length of time to ambulate 10 feet: n/a sec.   Gait slow and steady without use of assistive device  Cognitive Function: Normal cognitive status assessed by direct observation by this Nurse Health Advisor. No abnormalities found.   MMSE - Mini Mental State Exam 04/29/2018   Orientation to time  5  Orientation to Place 5  Registration 3  Attention/ Calculation 4  Recall 0  Language- name 2 objects 2  Language- repeat 1  Language- follow 3 step command 3  Language- read & follow direction 1  Write a sentence 1  Copy design 1  Total score 26     6CIT Screen 05/10/2020 05/05/2019  What Year? 0 points 0 points  What month? 0 points 0 points  What time? 0 points 0 points  Count back from 20 0 points 0 points  Months in reverse 0 points 4 points  Repeat phrase 0 points 2 points  Total Score 0 6    Immunizations Immunization History  Administered Date(s) Administered   Fluad Quad(high Dose 65+) 05/05/2019, 05/10/2020   Influenza Split 05/22/2011, 04/28/2012   Influenza Whole 05/11/2007, 04/19/2008, 07/05/2008, 05/15/2009, 04/29/2010   Influenza, High Dose Seasonal PF 05/25/2013, 04/30/2016, 05/07/2017, 04/29/2018   Influenza,inj,Quad PF,6+ Mos 05/05/2014, 05/10/2015   Influenza-Unspecified 05/01/2021   PFIZER(Purple Top)SARS-COV-2 Vaccination 08/12/2019, 09/02/2019   Pneumococcal Conjugate-13 05/05/2014   Pneumococcal Polysaccharide-23 07/29/1999   Td 01/04/1997   Tetanus 07/14/2012   Zoster, Live 06/08/2014    TDAP status: Up to date  Flu Vaccine status: Up to date  Pneumococcal vaccine status: Up to date  Covid-19 vaccine status: Completed vaccines  Qualifies for Shingles Vaccine? Yes   Zostavax completed Yes   Shingrix Completed?: No.    Education has been provided regarding the importance of this vaccine. Patient has been advised to call insurance company to determine out of pocket expense if they have not yet received this vaccine. Advised may also receive vaccine at local pharmacy or Health Dept. Verbalized acceptance and understanding.  Screening Tests Health Maintenance  Topic Date Due   Zoster Vaccines- Shingrix (1 of 2) Never done   COVID-19 Vaccine (3 - Booster for Pfizer series) 10/28/2019   DEXA SCAN  12/27/2028 (Originally  05/13/1989)   TETANUS/TDAP  07/14/2022   Pneumonia Vaccine 20+ Years old  Completed   INFLUENZA VACCINE  Completed   HPV VACCINES  Aged Out    Health Maintenance  Health Maintenance Due  Topic Date Due   Zoster Vaccines- Shingrix (1 of 2) Never done   COVID-19 Vaccine (3 - Booster for Pfizer series) 10/28/2019    Colorectal cancer screening: No longer required.   Mammogram status: No longer required due to age.  Lung Cancer Screening: (Low Dose CT Chest recommended if Age 89-80 years, 30 pack-year currently smoking OR have quit w/in 15years.) does not qualify.   Lung Cancer Screening Referral: no  Additional Screening:  Hepatitis C Screening: does not qualify; Completed no  Vision Screening: Recommended annual ophthalmology exams for early detection of glaucoma and other disorders of the eye. Is the patient up to date with their annual eye exam?  Yes  Who is the provider or what is the name of the office in which the patient attends annual eye exams? Luberta Mutter, MD. If pt is not established with a provider, would they like to be referred to a provider to establish care? No .   Dental Screening: Recommended annual dental exams for proper oral hygiene  Community Resource Referral / Chronic Care Management: CRR required this visit?  No   CCM required this visit?  No      Plan:     I have personally reviewed and noted the following in the patient's chart:   Medical and social history Use of alcohol, tobacco or illicit drugs  Current medications and supplements including opioid prescriptions.  Functional ability and status Nutritional status Physical activity Advanced directives List of other physicians Hospitalizations, surgeries, and ER visits in previous 12 months Vitals Screenings to include cognitive, depression, and falls Referrals and appointments  In addition, I have reviewed and discussed with patient certain preventive protocols, quality metrics,  and best practice recommendations. A written personalized care plan for preventive services as well as general preventive health recommendations were provided to patient.     Sheral Flow, LPN   28/08/4173   Nurse Notes:  Patient is cogitatively intact. There were no vitals filed for this visit. There is no height or weight on file to calculate BMI.

## 2021-07-08 ENCOUNTER — Telehealth: Payer: Self-pay | Admitting: Internal Medicine

## 2021-07-08 NOTE — Telephone Encounter (Signed)
Patient daughter Denise Wagner calling in  Patient fell inside her kitchen 12/03 & was seen by ED.Denise Wagner says patient is currently taking 2 extra strength Tylenol in the AM.. 200mg  Advil around lunch time.. & another 2 extra strength Tylenol before bed (also doing hot & cold compresses throughout the day)  Patient is still in a lot of pain especially when waking up in the morning  Wants to know what is the max daily limit patient can have for pain meds  Please call [phone]

## 2021-07-08 NOTE — Telephone Encounter (Signed)
Spoke with patient's daughter today and info given.

## 2021-07-08 NOTE — Telephone Encounter (Signed)
Maximum daily Tylenol intake is 3000 mg and she should take a maximum as long as needed  Is okay to take some ibuprofen, but I would keep it to a minimum just because its not good for the kidneys or stomach and has more side effects than the Tylenol.  Ice, heat and any topical medication are okay and preferred over the ibuprofen.

## 2021-07-09 DIAGNOSIS — M1711 Unilateral primary osteoarthritis, right knee: Secondary | ICD-10-CM | POA: Diagnosis not present

## 2021-07-09 DIAGNOSIS — E559 Vitamin D deficiency, unspecified: Secondary | ICD-10-CM | POA: Diagnosis not present

## 2021-07-09 DIAGNOSIS — R5383 Other fatigue: Secondary | ICD-10-CM | POA: Diagnosis not present

## 2021-07-09 DIAGNOSIS — S32000D Wedge compression fracture of unspecified lumbar vertebra, subsequent encounter for fracture with routine healing: Secondary | ICD-10-CM | POA: Diagnosis not present

## 2021-07-09 DIAGNOSIS — S32000A Wedge compression fracture of unspecified lumbar vertebra, initial encounter for closed fracture: Secondary | ICD-10-CM | POA: Diagnosis not present

## 2021-07-12 ENCOUNTER — Telehealth (HOSPITAL_COMMUNITY): Payer: Self-pay | Admitting: Cardiology

## 2021-07-12 NOTE — Telephone Encounter (Signed)
Patients daughter called and cancelled echocardiogram x2 for reason below:  07/12/21 07/12/2021 1:35 PM By: By: Randall Hiss, SELENA  Cancel Rsn: Patient (daughter cannot bring her, patient has a fracture in her back and does not feel like traveling, they will call back to reschedule   Order will be removed from the echo WQ and when pt calls back to reschedule we will reinstate the order. Thank you.

## 2021-07-15 ENCOUNTER — Other Ambulatory Visit (HOSPITAL_COMMUNITY): Payer: Medicare Other

## 2021-07-30 ENCOUNTER — Telehealth: Payer: Self-pay | Admitting: Hematology and Oncology

## 2021-07-30 NOTE — Telephone Encounter (Signed)
Scheduled appt per 12/29 referral. Pt's daughter is aware of appt date and time. She is also aware pt needs to be here 15 mins prior to her appt time.

## 2021-08-08 ENCOUNTER — Telehealth: Payer: Self-pay | Admitting: Hematology and Oncology

## 2021-08-08 NOTE — Telephone Encounter (Signed)
Pt's caregiver called in to r/s pts new hem appt. R/s appt, she is aware of new appt date and time.

## 2021-08-16 ENCOUNTER — Inpatient Hospital Stay: Payer: Medicare Other

## 2021-08-16 ENCOUNTER — Inpatient Hospital Stay: Payer: Medicare Other | Admitting: Hematology and Oncology

## 2021-08-30 ENCOUNTER — Inpatient Hospital Stay: Payer: Medicare Other

## 2021-08-30 ENCOUNTER — Inpatient Hospital Stay: Payer: Medicare Other | Admitting: Hematology and Oncology

## 2021-08-31 ENCOUNTER — Emergency Department (HOSPITAL_BASED_OUTPATIENT_CLINIC_OR_DEPARTMENT_OTHER): Payer: Medicare Other | Admitting: Radiology

## 2021-08-31 ENCOUNTER — Encounter (HOSPITAL_BASED_OUTPATIENT_CLINIC_OR_DEPARTMENT_OTHER): Payer: Self-pay | Admitting: Emergency Medicine

## 2021-08-31 ENCOUNTER — Emergency Department (HOSPITAL_BASED_OUTPATIENT_CLINIC_OR_DEPARTMENT_OTHER)
Admission: EM | Admit: 2021-08-31 | Discharge: 2021-08-31 | Disposition: A | Discharge status: Home / Self Care | Payer: Medicare Other | Attending: Emergency Medicine | Admitting: Emergency Medicine

## 2021-08-31 ENCOUNTER — Other Ambulatory Visit: Payer: Self-pay

## 2021-08-31 DIAGNOSIS — Z7982 Long term (current) use of aspirin: Secondary | ICD-10-CM | POA: Insufficient documentation

## 2021-08-31 DIAGNOSIS — W19XXXA Unspecified fall, initial encounter: Secondary | ICD-10-CM | POA: Insufficient documentation

## 2021-08-31 DIAGNOSIS — S3992XA Unspecified injury of lower back, initial encounter: Secondary | ICD-10-CM | POA: Diagnosis present

## 2021-08-31 DIAGNOSIS — Z79899 Other long term (current) drug therapy: Secondary | ICD-10-CM | POA: Insufficient documentation

## 2021-08-31 DIAGNOSIS — S32010A Wedge compression fracture of first lumbar vertebra, initial encounter for closed fracture: Secondary | ICD-10-CM | POA: Diagnosis not present

## 2021-08-31 MED ORDER — DICLOFENAC SODIUM 1 % EX GEL: 4.0000 g | Freq: Four times a day (QID) | CUTANEOUS | 0 refills | Status: AC

## 2021-08-31 MED ORDER — METHYLPREDNISOLONE 4 MG PO TBPK: ORAL_TABLET | ORAL | 0 refills | Status: AC

## 2021-08-31 MED ORDER — OXYCODONE HCL 5 MG PO TABS
5.0000 mg | ORAL_TABLET | Freq: Once | ORAL | Status: AC
  Administered 2021-08-31: 5 mg via ORAL
  Filled 2021-08-31: qty 1

## 2021-08-31 MED ORDER — ACETAMINOPHEN 500 MG PO TABS
1000.0000 mg | ORAL_TABLET | Freq: Once | ORAL | Status: AC
Start: 2021-08-31 — End: 2021-08-31
  Administered 2021-08-31: 1000 mg via ORAL
  Filled 2021-08-31: qty 2

## 2021-08-31 MED ORDER — KETOROLAC TROMETHAMINE 15 MG/ML IJ SOLN
15.0000 mg | Freq: Once | INTRAMUSCULAR | Status: AC
Start: 2021-08-31 — End: 2021-08-31
  Administered 2021-08-31: 15 mg via INTRAMUSCULAR
  Filled 2021-08-31: qty 1

## 2021-08-31 NOTE — ED Notes (Signed)
Dc instructions reviewed with patient. Patient voiced understanding. Dc with belongings.  °

## 2021-08-31 NOTE — Discharge Instructions (Signed)
Follow up with your back doctor in the office.  Return for worsening pain, fever, leg weakness or numbness.  Use the gel as prescribed.  Take the steroids as prescribed. Also take tylenol 1000mg (2 extra strength) four times a day.

## 2021-08-31 NOTE — ED Triage Notes (Signed)
Pt presents with worsening back pain for 2 days. Pt had a fall in December and has back brace on. Daughter reports pt walks with walker but needed more help today for ADLs. Pt has been alternating between tylenol and advil since prior fall. Denies any new falls.

## 2021-08-31 NOTE — ED Provider Notes (Signed)
[facility] EMERGENC[facility] DEPT Provider Note   CSN: [id number] Arrival date & time: 08/31/21  1213     History  Chief Complaint  Patient presents with   Back Pain    Denise Wagner is a 88 y.o. female.  88 yo F with a chief complaints of low back pain.  This is been going on since December 2.  She had suffered a fall and was seen in the emergency department.  She had a work-up done there that was unremarkable but after having some back pain a few days later when she was seeing her orthopedist was diagnosed with a new compression fracture.  She had improvement with a lumbar corset that then Tylenol and ibuprofen.  Unfortunately about 48 hours ago had recurrence of similar symptoms.  She denies any new injury.  Denies loss of bowel or bladder denies loss of her sensation.  Pain is worse with movement palpation and twisting.   Back Pain     Home Medications Prior to Admission medications   Medication Sig Start Date End Date Taking? Authorizing Provider  diclofenac Sodium (VOLTAREN) 1 % GEL Apply 4 g topically 4 (four) times daily. 08/31/21  Yes Deno Etienne, DO  methylPREDNISolone (MEDROL DOSEPAK) 4 MG TBPK tablet Day 1: 8mg  before breakfast, 4 mg after lunch, 4 mg after supper, and 8 mg at bedtime Day 2: 4 mg before breakfast, 4 mg after lunch, 4 mg  after supper, and 8 mg  at bedtime Day 3:  4 mg  before breakfast, 4 mg  after lunch, 4 mg after supper, and 4 mg  at bedtime Day 4: 4 mg  before breakfast, 4 mg  after lunch, and 4 mg at bedtime Day 5: 4 mg  before breakfast and 4 mg at bedtime Day 6: 4 mg  before breakfast 08/31/21  Yes Deno Etienne, DO  amLODipine (NORVASC) 2.5 MG tablet TAKE 1 TABLET DAILY 03/20/21   Binnie Rail, MD  aspirin 81 MG tablet Take 81 mg by mouth daily.    [provider]  CALCIUM-VITAMIN D PO Take 1 tablet by mouth daily.     [provider]  Cholecalciferol (VITAMIN D3) 1000 UNITS CAPS Take 1,000 mg by mouth daily.    [provider]  Ibuprofen (ADVIL PO) Take 1 tablet by mouth daily as needed (PAIN).    [provider]  L-Lysine 500 MG TABS Take 500 mg by mouth daily.    [provider]  loratadine (CLARITIN) 10 MG tablet Take 10 mg by mouth every evening.    [provider]  losartan-hydrochlorothiazide (HYZAAR) 100-25 MG tablet TAKE 1 TABLET DAILY 03/11/21   Binnie Rail, MD  Magnesium 300 MG CAPS Take 1 capsule (300 mg total) by mouth daily. 06/26/21   Binnie Rail, MD  meclizine (ANTIVERT) 12.5 MG tablet Take 1 tablet (12.5 mg total) by mouth 3 (three) times daily as needed for dizziness. 06/26/21   Binnie Rail, MD  metoprolol tartrate (LOPRESSOR) 25 MG tablet TAKE 1 TABLET TWICE A DAY 01/30/21   Burns, Claudina Lick, MD  montelukast (SINGULAIR) 10 MG tablet Take 1 tablet (10 mg total) by mouth at bedtime. 01/02/21   Binnie Rail, MD  Multiple Vitamins-Calcium (VIACTIV MULTI-VITAMIN PO) Take 2 tablets by mouth daily.    [provider]  Omega-3 Fatty Acids (FISH OIL) 1000 MG CAPS Take 1,000 mg by mouth 2 (two) times daily.     [provider]  vitamin C (ASCORBIC ACID) 500 MG tablet Take 500 mg by mouth daily.     [provider]  vitamin E 180 MG (400 UNITS) capsule Take 400 Units by mouth daily.    [provider]  Zinc Acetate, Oral, (ZINC ACETATE PO) Take 1 tablet by mouth daily.    [provider]      Allergies    Doxycycline, Metronidazole, Penicillins, Sulfonamide derivatives, and Pravastatin    Review of Systems   Review of Systems  Musculoskeletal:  Positive for back pain.   Physical Exam Updated Vital Signs BP (!) 177/61 (BP Location: Right Arm)    Pulse 65    Temp 98.9 F (37.2 C) (Oral)    Resp 16    Ht 5\' 5"  (1.651 m)    Wt 68 kg    SpO2 98%    BMI 24.96 kg/m  Physical Exam Vitals and nursing note reviewed.  Constitutional:      General: She is not in acute distress.    Appearance: She is well-developed. She is  not diaphoretic.  HENT:     Head: Normocephalic and atraumatic.  Eyes:     Pupils: Pupils are equal, round, and reactive to light.  Cardiovascular:     Rate and Rhythm: Normal rate and regular rhythm.     Heart sounds: No murmur heard.   No friction rub. No gallop.  Pulmonary:     Effort: Pulmonary effort is normal.     Breath sounds: No wheezing or rales.  Abdominal:     General: There is no distension.     Palpations: Abdomen is soft.     Tenderness: There is no abdominal tenderness.  Musculoskeletal:        General: No tenderness.     Cervical back: Normal range of motion and neck supple.     Comments: Pulse motor and sensation intact in bilateral lower extremities.  No obvious pain step-offs or deformities to the midline L-spine.  Skin:    General: Skin is warm and dry.  Neurological:     Mental Status: She is alert and oriented to person, place, and time.  Psychiatric:        Behavior: Behavior normal.    ED Results / Procedures / Treatments   Labs (all labs ordered are listed, but only abnormal results are displayed) Labs Reviewed - No data to display  EKG None  Radiology DG Lumbar Spine Complete  Result Date: 08/31/2021 CLINICAL DATA:  Low back with know compression fracture EXAM: LUMBAR SPINE - COMPLETE 4+ VIEW COMPARISON:  Chest radiograph from 07/14/2021 FINDINGS: Curvature of the lumbar spine is convex towards the left. There is a compression fracture involving the L1 vertebral body which appears new from 06/14/2021. There is loss of approximately 30% of the superior endplate height. Mild multi level disc space narrowing and endplate spurring noted. Extensive aortic atherosclerotic calcifications. IMPRESSION: L1 compression fracture with loss of approximately 30% of the superior endplate height. Although age indeterminate this is new compared with 06/14/2021. Electronically Signed   By: Kerby Moors M.D.   On: 08/31/2021 14:28    Procedures Procedures     Medications Ordered in ED Medications  acetaminophen (TYLENOL) tablet 1,000 mg (1,000 mg Oral Given 08/31/21 1335)  oxyCODONE (Oxy IR/ROXICODONE) immediate release tablet 5 mg (5 mg Oral Given 08/31/21 1335)  ketorolac (TORADOL) 15 MG/ML injection 15 mg (15 mg Intramuscular Given 08/31/21 1335)    ED Course/ Medical Decision Making/ A&P  Medical Decision Making Amount and/or Complexity of Data Reviewed Radiology: ordered.  Risk OTC drugs. Prescription drug management.   88 yo F with a chief complaints of low back pain.  Patient has been seen by orthopedics for this.  Was diagnosed with a compression fracture.  Had significant improvement with a lumbar corset that is in Tylenol and ibuprofen.  She is here for worsening of pain over the past 48 hours.  Other than age she has no red flags.  I will obtain a plain film to assess for new pathology.  Her records were reviewed does see cardiologist in the office for hypertension hyperlipidemia and leg swelling that is chronic for her.  Plain film viewed by me with a L1 compression fracture.  Unable to access patient's x-ray from [facility], L1 compression fracture is new from our last image.  Likely is the fracture that was seen in the orthopedic office.  Patient reassessed and feeling mildly better.  Have her continue her lumbar corset  follow-up with orthopedics.  3:08 PM:  I have discussed the diagnosis/risks/treatment options with the patient.  Evaluation and diagnostic testing in the emergency department does not suggest an emergent condition requiring admission or immediate intervention beyond what has been performed at this time.  They will follow up with  ortho. We also discussed returning to the ED immediately if new or worsening sx occur. We discussed the sx which are most concerning (e.g., sudden worsening pain, fever, inability to tolerate by mouth, cauda equina) that necessitate immediate return. Medications  administered to the patient during their visit and any new prescriptions provided to the patient are listed below.  Medications given during this visit Medications  acetaminophen (TYLENOL) tablet 1,000 mg (1,000 mg Oral Given 08/31/21 1335)  oxyCODONE (Oxy IR/ROXICODONE) immediate release tablet 5 mg (5 mg Oral Given 08/31/21 1335)  ketorolac (TORADOL) 15 MG/ML injection 15 mg (15 mg Intramuscular Given 08/31/21 1335)     The patient appears reasonably screen and/or stabilized for discharge and I doubt any other medical condition or other [facility] requiring further screening, evaluation, or treatment in the ED at this time prior to discharge.            Final Clinical Impression(s) / ED Diagnoses Final diagnoses:  Closed compression fracture of body of L1 vertebra (Erin)    Rx / DC Orders ED Discharge Orders          Ordered    methylPREDNISolone (MEDROL DOSEPAK) 4 MG TBPK tablet        08/31/21 1500    diclofenac Sodium (VOLTAREN) 1 % GEL  4 times daily        08/31/21 1500              [place], DO 08/31/21 1508

## 2021-11-08 ENCOUNTER — Ambulatory Visit: Payer: Medicare Other | Admitting: Internal Medicine

## 2021-12-03 ENCOUNTER — Ambulatory Visit: Payer: Medicare Other | Admitting: Internal Medicine

## 2021-12-19 IMAGING — DX DG CHEST 2V
2 series · 2 of 2 positions shown · non-contrast
Comparison: Chest x-ray 06/17/2016.

CLINICAL DATA: [AGE] female with history of cough.

EXAM:
CHEST - 2 VIEW

[chest pa]
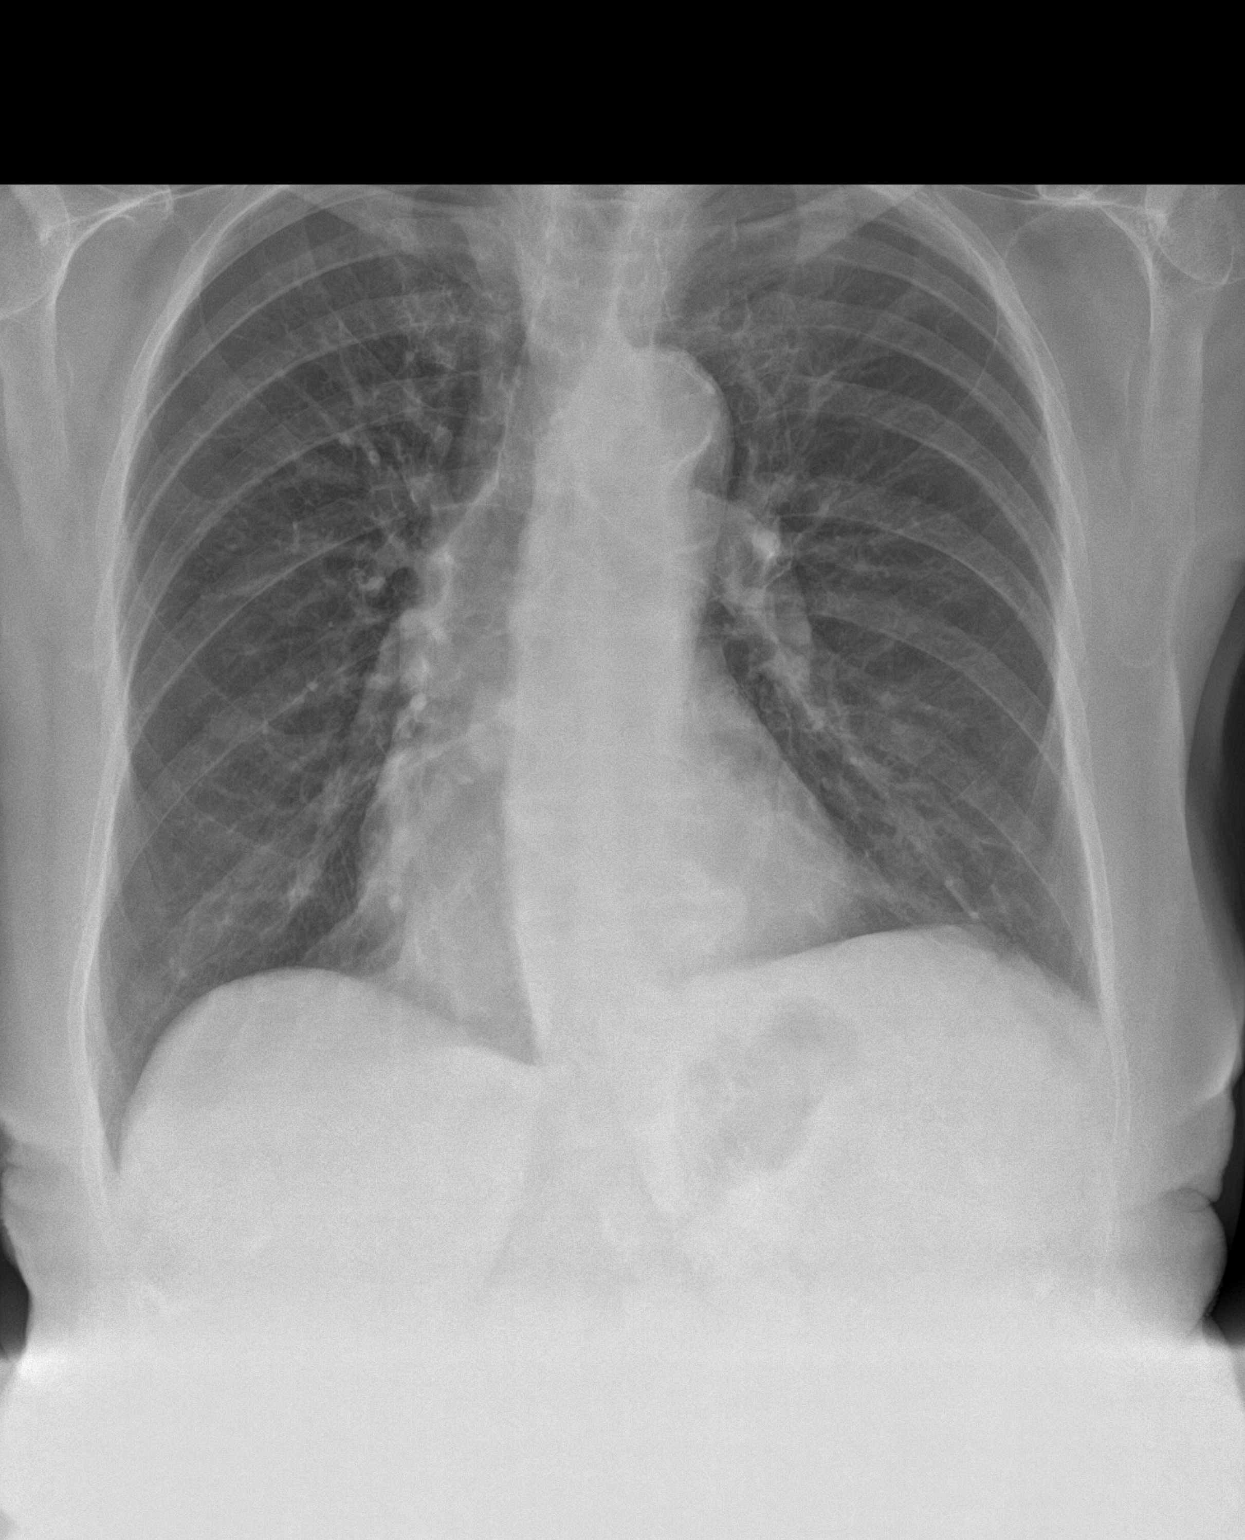

[chest lat]
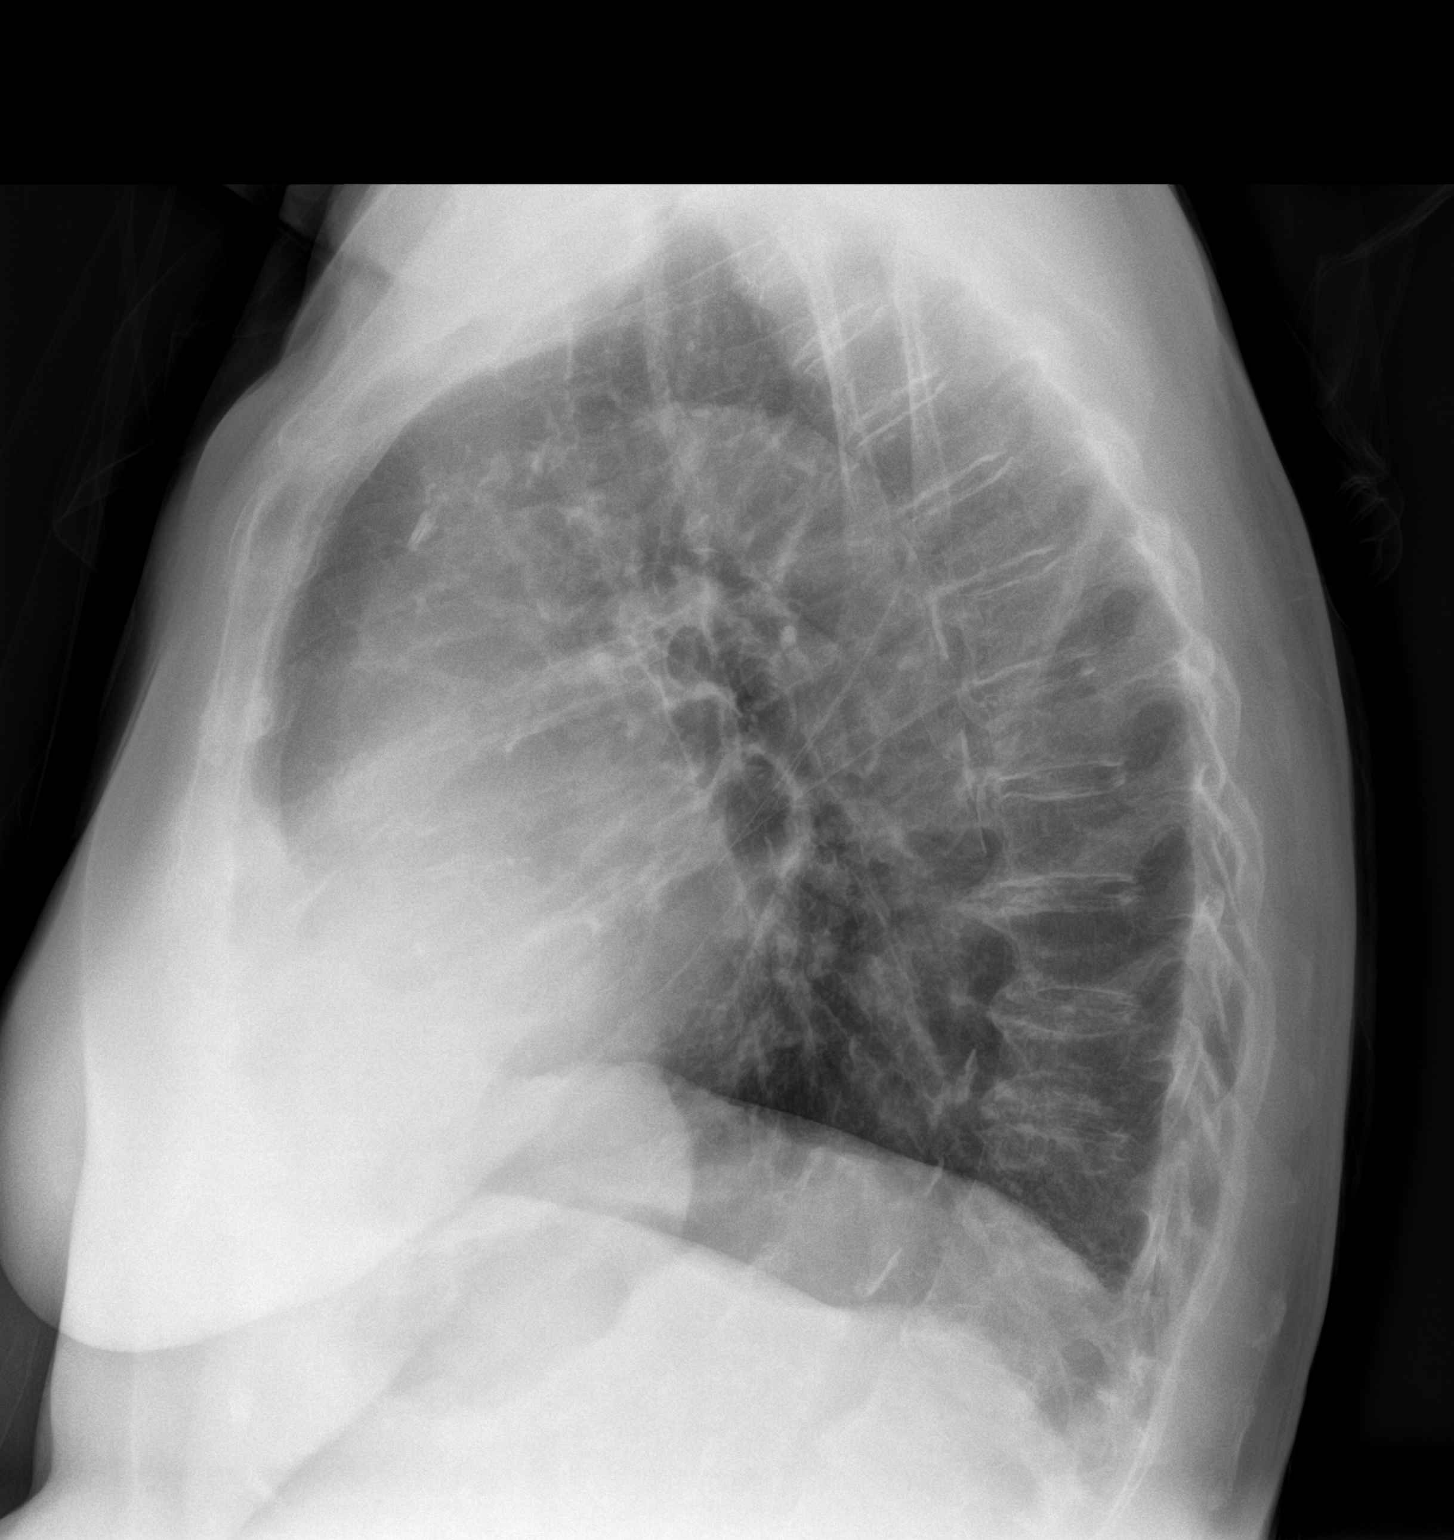

[2 of 2 positions shown; findings below may reference images not displayed]

FINDINGS: Lung volumes are normal. No consolidative airspace disease. No
pleural effusions. No pneumothorax. No pulmonary nodule or mass
noted. Pulmonary vasculature and the cardiomediastinal silhouette
are within normal limits. Atherosclerosis in the thoracic aorta.
IMPRESSION: 1.  No radiographic evidence of acute cardiopulmonary disease.
2. Aortic atherosclerosis.

## 2022-01-27 ENCOUNTER — Other Ambulatory Visit: Payer: Self-pay | Admitting: Internal Medicine

## 2022-01-27 DIAGNOSIS — I10 Essential (primary) hypertension: Secondary | ICD-10-CM

## 2022-03-04 ENCOUNTER — Other Ambulatory Visit: Payer: Self-pay | Admitting: Internal Medicine

## 2022-03-17 ENCOUNTER — Other Ambulatory Visit: Payer: Self-pay | Admitting: Internal Medicine

## 2022-06-25 ENCOUNTER — Telehealth: Payer: Self-pay | Admitting: Internal Medicine

## 2022-06-25 NOTE — Telephone Encounter (Signed)
N/A unable to leave a message for patient to call back to schedule Medicare Annual Wellness Visit   Last AWV  07/03/21  Please schedule at anytime with [facility] if patient calls the office back.      Any questions, please call me at [phone]

## 2022-09-23 ENCOUNTER — Telehealth: Payer: Self-pay

## 2022-09-23 NOTE — Telephone Encounter (Signed)
Pt will be transferring records .Marland Kitchen Tcm not done

## 2023-01-12 ENCOUNTER — Telehealth: Payer: Self-pay

## 2023-01-12 NOTE — Transitions of Care (Post Inpatient/ED Visit) (Signed)
   01/12/2023  Name: KERRAH WINDHOLZ MRN: [id number] DOB: 1923/09/10  Today's TOC FU Call Status: Today's TOC FU Call Status:: Unsuccessul Call (1st Attempt) Unsuccessful Call (1st Attempt) Date: 01/12/23  Attempted to reach the patient regarding the most recent Inpatient/ED visit.  Follow Up Plan: Additional outreach attempts will be made to reach the patient to complete the Transitions of Care (Post Inpatient/ED visit) call.   Signature TB,CMA

## 2023-01-14 ENCOUNTER — Telehealth: Payer: Self-pay

## 2023-01-14 NOTE — Transitions of Care (Post Inpatient/ED Visit) (Signed)
   01/14/2023  Name: TENASHA EGLE MRN: [id number] DOB: 01-Nov-1923  Today's TOC FU Call Status: Today's TOC FU Call Status:: Successful TOC FU Call Competed TOC FU Call Complete Date: 01/14/23  Attempted to reach the patient regarding the most recent Inpatient/ED visit.  Follow Up Plan: PT HAS TRANSFERRED CARE TO [facility]   Signature  TB,CMA

## 2023-07-29 DEATH — deceased
